# Patient Record
Sex: Male | Born: 1965 | Race: White | Hispanic: No | Marital: Married | Smoking: Former smoker
Health system: Southern US, Community
[De-identification: ages and names within clinical notes are randomized; demographics above are authoritative.]

## PROBLEM LIST (undated history)

## (undated) HISTORY — PX: KNEE SURGERY: SHX244

---
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
## 2004-05-10 ENCOUNTER — Ambulatory Visit: Payer: Self-pay | Admitting: Family Medicine

## 2004-06-04 ENCOUNTER — Ambulatory Visit: Payer: Self-pay | Admitting: Family Medicine

## 2005-04-19 ENCOUNTER — Ambulatory Visit: Payer: Self-pay | Admitting: Family Medicine

## 2006-11-28 ENCOUNTER — Ambulatory Visit: Payer: Self-pay | Admitting: Family Medicine

## 2006-11-28 LAB — CONVERTED CEMR LAB
ALT: 29 units/L (ref 0–40)
Albumin: 4.4 g/dL (ref 3.5–5.2)
Alkaline Phosphatase: 81 units/L (ref 39–117)
BUN: 18 mg/dL (ref 6–23)
Basophils Absolute: 0 10*3/uL (ref 0.0–0.1)
Basophils Relative: 0.2 % (ref 0.0–1.0)
CO2: 31 meq/L (ref 19–32)
Calcium: 9.6 mg/dL (ref 8.4–10.5)
Cholesterol: 228 mg/dL (ref 0–200)
Direct LDL: 129.6 mg/dL
GFR calc Af Amer: 96 mL/min
GFR calc non Af Amer: 79 mL/min
HDL: 62.6 mg/dL (ref >39.0)
Hemoglobin: 16.2 g/dL (ref 13.0–17.0)
Lymphocytes Relative: 25.9 % (ref 12.0–46.0)
MCHC: 35.1 g/dL (ref 30.0–36.0)
Monocytes Absolute: 0.4 10*3/uL (ref 0.2–0.7)
Monocytes Relative: 7.9 % (ref 3.0–11.0)
Neutro Abs: 3.3 10*3/uL (ref 1.4–7.7)
Platelets: 194 10*3/uL (ref 150–400)
Potassium: 4.6 meq/L (ref 3.5–5.1)
TSH: 0.94 microintl units/mL (ref 0.35–5.50)
Total CHOL/HDL Ratio: 3.6
VLDL: 20 mg/dL (ref 0–40)

## 2008-02-26 ENCOUNTER — Ambulatory Visit: Payer: Self-pay | Admitting: Family Medicine

## 2008-02-26 DIAGNOSIS — N508 Other specified disorders of male genital organs: Secondary | ICD-10-CM | POA: Insufficient documentation

## 2008-03-01 ENCOUNTER — Encounter (INDEPENDENT_AMBULATORY_CARE_PROVIDER_SITE_OTHER): Payer: Self-pay | Admitting: *Deleted

## 2008-03-10 ENCOUNTER — Encounter (INDEPENDENT_AMBULATORY_CARE_PROVIDER_SITE_OTHER): Payer: Self-pay | Admitting: *Deleted

## 2008-06-06 ENCOUNTER — Telehealth: Payer: Self-pay | Admitting: Family Medicine

## 2008-06-09 ENCOUNTER — Ambulatory Visit: Payer: Self-pay | Admitting: Family Medicine

## 2008-06-14 ENCOUNTER — Encounter (INDEPENDENT_AMBULATORY_CARE_PROVIDER_SITE_OTHER): Payer: Self-pay | Admitting: *Deleted

## 2009-02-27 ENCOUNTER — Ambulatory Visit: Payer: Self-pay | Admitting: Family Medicine

## 2009-02-27 DIAGNOSIS — R519 Headache, unspecified: Secondary | ICD-10-CM | POA: Insufficient documentation

## 2009-02-27 DIAGNOSIS — R51 Headache: Secondary | ICD-10-CM | POA: Insufficient documentation

## 2009-02-27 DIAGNOSIS — D239 Other benign neoplasm of skin, unspecified: Secondary | ICD-10-CM | POA: Insufficient documentation

## 2010-03-02 ENCOUNTER — Ambulatory Visit: Payer: Self-pay | Admitting: Family Medicine

## 2010-03-02 DIAGNOSIS — F329 Major depressive disorder, single episode, unspecified: Secondary | ICD-10-CM | POA: Insufficient documentation

## 2010-03-28 ENCOUNTER — Ambulatory Visit: Payer: Self-pay | Admitting: Family Medicine

## 2010-07-29 LAB — CONVERTED CEMR LAB
ALT: 29 units/L (ref 0–53)
ALT: 54 units/L — ABNORMAL HIGH (ref 0–53)
AST: 20 units/L (ref 0–37)
AST: 31 units/L (ref 0–37)
Albumin: 4.2 g/dL (ref 3.5–5.2)
Albumin: 4.4 g/dL (ref 3.5–5.2)
Alkaline Phosphatase: 80 units/L (ref 39–117)
BUN: 16 mg/dL (ref 6–23)
Basophils Absolute: 0 10*3/uL (ref 0.0–0.1)
Basophils Relative: 0.1 % (ref 0.0–3.0)
Basophils Relative: 0.5 % (ref 0.0–3.0)
Chloride: 105 meq/L (ref 96–112)
Cholesterol: 195 mg/dL (ref 0–200)
Creatinine, Ser: 1.1 mg/dL (ref 0.4–1.5)
Eosinophils Absolute: 0.1 10*3/uL (ref 0.0–0.7)
Eosinophils Relative: 1.5 % (ref 0.0–5.0)
Eosinophils Relative: 1.6 % (ref 0.0–5.0)
Eosinophils Relative: 2.1 % (ref 0.0–5.0)
GFR calc Af Amer: 95 mL/min
GFR calc non Af Amer: 78 mL/min
GFR calc non Af Amer: 87.07 mL/min (ref >60)
GFR calc non Af Amer: 93.07 mL/min (ref >60)
Glucose, Bld: 74 mg/dL (ref 70–99)
HCT: 44.6 % (ref 39.0–52.0)
HCT: 45.3 % (ref 39.0–52.0)
HCT: 47.9 % (ref 39.0–52.0)
HDL: 61.3 mg/dL (ref >39.0)
HDL: 63.4 mg/dL (ref >39.00)
Hemoglobin: 15.4 g/dL (ref 13.0–17.0)
Hemoglobin: 15.6 g/dL (ref 13.0–17.0)
LDL Cholesterol: 124 mg/dL — ABNORMAL HIGH (ref 0–99)
Lymphs Abs: 1.1 10*3/uL (ref 0.7–4.0)
Lymphs Abs: 1.2 10*3/uL (ref 0.7–4.0)
MCHC: 34.6 g/dL (ref 30.0–36.0)
MCV: 92 fL (ref 78.0–100.0)
Monocytes Absolute: 0.4 10*3/uL (ref 0.1–1.0)
Monocytes Relative: 7.6 % (ref 3.0–12.0)
Monocytes Relative: 8.5 % (ref 3.0–12.0)
Neutro Abs: 3.1 10*3/uL (ref 1.4–7.7)
Neutro Abs: 4.9 10*3/uL (ref 1.4–7.7)
Neutrophils Relative %: 66.4 % (ref 43.0–77.0)
Platelets: 164 10*3/uL (ref 150.0–400.0)
Platelets: 184 10*3/uL (ref 150–400)
Potassium: 4.4 meq/L (ref 3.5–5.1)
Potassium: 4.7 meq/L (ref 3.5–5.1)
Potassium: 5.2 meq/L — ABNORMAL HIGH (ref 3.5–5.1)
Protein, U semiquant: NEGATIVE
RBC: 4.94 M/uL (ref 4.22–5.81)
Sex Hormone Binding: 48 nmol/L (ref 13–71)
Sodium: 138 meq/L (ref 135–145)
Sodium: 141 meq/L (ref 135–145)
TSH: 0.63 microintl units/mL (ref 0.35–5.50)
TSH: 1.05 microintl units/mL (ref 0.35–5.50)
TSH: 1.11 microintl units/mL (ref 0.35–5.50)
Testosterone Free: 82.2 pg/mL (ref 47.0–244.0)
Testosterone-% Free: 1.6 % (ref 1.6–2.9)
Total Bilirubin: 1.1 mg/dL (ref 0.3–1.2)
Total Bilirubin: 1.3 mg/dL — ABNORMAL HIGH (ref 0.3–1.2)
Total CHOL/HDL Ratio: 4
Triglycerides: 77 mg/dL (ref 0–149)
Urobilinogen, UA: NEGATIVE
VLDL: 15 mg/dL (ref 0–40)
VLDL: 16.8 mg/dL (ref 0.0–40.0)
VLDL: 22.6 mg/dL (ref 0.0–40.0)
WBC Urine, dipstick: NEGATIVE
WBC: 4.7 10*3/uL (ref 4.5–10.5)
WBC: 4.8 10*3/uL (ref 4.5–10.5)
WBC: 6.8 10*3/uL (ref 4.5–10.5)

## 2010-07-31 NOTE — Assessment & Plan Note (Signed)
Summary: cpx//Joel Gallegos will be fasting//lch   Vital Signs:  Patient profile:   44 year old male Height:      72 inches Weight:      172.0 pounds BMI:     23.41 Temp:     97.7 degrees F oral Pulse rate:   56 / minute BP sitting:   112 / 72  (right arm)  Vitals Entered By: [name] CMA Duncan Dull) (March 02, 2010 9:06 AM) CC: cpx/fasting   History of Present Illness: Joel Gallegos here for cpe and labs.  No complaints.    Preventive Screening-Counseling & Management  Alcohol-Tobacco     Alcohol drinks/day: 1     Alcohol type: beer     Smoking Status: quit > 6 months     Packs/Day: <0.25     Year Started: 1988     Year Quit: 1990  Caffeine-Diet-Exercise     Caffeine use/day: 1     Does Patient Exercise: yes     Type of exercise: walking     Times/week: 5  Hep-HIV-STD-Contraception     HIV Risk: no     Dental Visit-last 6 months yes     Dental Care Counseling: not indicated; dental care within six months     Sun Exposure-Excessive: no  Safety-Violence-Falls     Seat Belt Use: yes      Sexual History:  currently monogamous.    Current Medications (verified): 1)  Desoximetasone 0.25 % Oint (Desoximetasone) .... Apply Two Times A Day For 2 Weeks  Allergies (verified): No Known Drug Allergies  Past History:  Past Medical History: Last updated: 11/21/2006 Unremarkable  Past Surgical History: Last updated: 11/21/2006 Knee Surgery (Both) 1984  Family History: Last updated: 02/26/2008 None  Social History: Last updated: 02/27/2009 Occupation: [employer]--  [employer] Married Never Smoked Alcohol use-yes Drug use-no Regular exercise-yes  Risk Factors: Alcohol Use: 1 (03/02/2010) Caffeine Use: 1 (03/02/2010) Exercise: yes (03/02/2010)  Risk Factors: Smoking Status: quit > 6 months (03/02/2010) Packs/Day: <0.25 (03/02/2010)  Family History: Reviewed history from 02/26/2008 and no changes required. None  Social History: Reviewed history from 02/27/2009  and no changes required. Occupation: [employer]--  [employer] Married Never Smoked Alcohol use-yes Drug use-no Regular exercise-yes Smoking Status:  quit > 6 months Packs/Day:  <0.25  Review of Systems      See HPI General:  Denies chills, fatigue, fever, loss of appetite, malaise, sleep disorder, sweats, weakness, and weight loss. Eyes:  Denies blurring, discharge, double vision, eye irritation, eye pain, halos, itching, light sensitivity, red eye, vision loss-1 eye, and vision loss-both eyes; optho-- q1y. ENT:  Denies decreased hearing, difficulty swallowing, ear discharge, earache, hoarseness, nasal congestion, nosebleeds, postnasal drainage, ringing in ears, sinus pressure, and sore throat. CV:  Denies bluish discoloration of lips or nails, chest pain or discomfort, difficulty breathing at night, difficulty breathing while lying down, fainting, fatigue, leg cramps with exertion, lightheadness, near fainting, palpitations, shortness of breath with exertion, swelling of feet, swelling of hands, and weight gain. Resp:  Denies chest discomfort, chest pain with inspiration, cough, coughing up blood, excessive snoring, hypersomnolence, morning headaches, pleuritic, shortness of breath, sputum productive, and wheezing. GI:  Denies abdominal pain, bloody stools, change in bowel habits, constipation, dark tarry stools, diarrhea, excessive appetite, gas, hemorrhoids, indigestion, loss of appetite, nausea, vomiting, vomiting blood, and yellowish skin color. GU:  Denies decreased libido, discharge, dysuria, erectile dysfunction, genital sores, hematuria, incontinence, nocturia, urinary frequency, and urinary hesitancy. MS:  Denies joint pain, joint  redness, joint swelling, loss of strength, low back pain, mid back pain, muscle aches, muscle , cramps, muscle weakness, stiffness, and thoracic pain. Derm:  Denies changes in color of skin, changes in nail beds, dryness, excessive perspiration, flushing,  hair loss, insect bite(s), itching, lesion(s), poor wound healing, and rash; derm q1y. Neuro:  Denies brief paralysis, difficulty with concentration, disturbances in coordination, falling down, headaches, inability to speak, memory loss, numbness, poor balance, seizures, sensation of room spinning, tingling, tremors, visual disturbances, and weakness. Psych:  Denies alternate hallucination ( auditory/visual), anxiety, depression, easily angered, easily tearful, irritability, mental problems, panic attacks, sense of great danger, suicidal thoughts/plans, thoughts of violence, unusual visions or sounds, and thoughts /plans of harming others. Endo:  Denies cold intolerance, excessive hunger, excessive thirst, excessive urination, heat intolerance, polyuria, and weight change. Heme:  Denies abnormal bruising, bleeding, enlarge lymph nodes, fevers, pallor, and skin discoloration. Allergy:  Denies hives or rash, itching eyes, persistent infections, seasonal allergies, and sneezing.  Physical Exam  General:  Well-developed,well-nourished,in no acute distress; alert,appropriate and cooperative throughout examination Head:  Normocephalic and atraumatic without obvious abnormalities. No apparent alopecia or balding. Eyes:  vision grossly intact, pupils equal, pupils round, pupils reactive to light, and no injection.  + glassesvision grossly intact, pupils equal, pupils round, pupils reactive to light, and no injection.   Ears:  External ear exam shows no significant lesions or deformities.  Otoscopic examination reveals clear canals, tympanic membranes are intact bilaterally without bulging, retraction, inflammation or discharge. Hearing is grossly normal bilaterally. Nose:  External nasal examination shows no deformity or inflammation. Nasal mucosa are pink and moist without lesions or exudates. Mouth:  Oral mucosa and oropharynx without lesions or exudates.  Teeth in good repair. Neck:  No deformities,  masses, or tenderness noted.no carotid bruits.  no carotid bruits.   Chest Wall:  No deformities, masses, tenderness or gynecomastia noted. Lungs:  Normal respiratory effort, chest expands symmetrically. Lungs are clear to auscultation, no crackles or wheezes. Heart:  normal rate and no murmur.  normal rate and no murmur.   Abdomen:  Bowel sounds positive,abdomen soft and non-tender without masses, organomegaly or hernias noted. Rectal:  No external abnormalities noted. Normal sphincter tone. No rectal masses or tenderness. heme negative brown stool  Genitalia:  Testes bilaterally descended without nodularity, tenderness or masses. No scrotal masses or lesions. No penis lesions or urethral discharge. Prostate:  Prostate gland firm and smooth, no enlargement, nodularity, tenderness, mass, asymmetry or induration. Msk:  normal ROM, no joint tenderness, no joint swelling, no joint warmth, no redness over joints, no joint deformities, no joint instability, and no crepitation.  normal ROM, no joint tenderness, no joint swelling, no joint warmth, no redness over joints, no joint deformities, no joint instability, and no crepitation.   Pulses:  R posterior tibial normal, R dorsalis pedis normal, R carotid normal, L popliteal normal, L posterior tibial normal, and L dorsalis pedis normal.  R posterior tibial normal, R dorsalis pedis normal, R carotid normal, L popliteal normal, L posterior tibial normal, and L dorsalis pedis normal.   Extremities:  No clubbing, cyanosis, edema, or deformity noted with normal full range of motion of all joints.   Neurologic:  No cranial nerve deficits noted. Station and gait are normal. Plantar reflexes are down-going bilaterally. DTRs are symmetrical throughout. Sensory, motor and coordinative functions appear intact. Skin:  Intact without suspicious lesions or rashes Cervical Nodes:  No lymphadenopathy noted Axillary Nodes:  No palpable lymphadenopathy Psych:  Cognition  and judgment appear intact. Alert and cooperative with normal attention span and concentration. No apparent delusions, illusions, hallucinations   Impression & Recommendations:  Problem # 1:  PREVENTIVE HEALTH CARE (ICD-V70.0) ghm utd  Orders: Specimen Handling (86578) Venipuncture (46962) TLB-Lipid Panel (80061-LIPID) TLB-BMP (Basic Metabolic Panel-BMET) (80048-METABOL) TLB-CBC Platelet - w/Differential (85025-CBCD) TLB-Hepatic/Liver Function Pnl (80076-HEPATIC) TLB-TSH (Thyroid Stimulating Hormone) (84443-TSH) TLB-PSA (Prostate Specific Antigen) (84153-PSA) EKG w/ Interpretation (93000)  Reviewed preventive care protocols, scheduled due services, and updated immunizations.  Complete Medication List: 1)  Desoximetasone 0.25 % Oint (Desoximetasone) .... Apply two times a day for 2 weeks  Patient Instructions: 1)  Please schedule a follow-up appointment in 1 year.  Prescriptions: DESOXIMETASONE 0.25 % OINT (DESOXIMETASONE) apply two times a day for 2 weeks  #30g x 2   Entered and Authorized by:   Loreen Freud DO   Signed by:   Loreen Freud DO on 03/02/2010   Method used:   Electronically to        [facility]  [address] [phone]* (retail)       [address]       [place], [place]  [address]       Ph: [phone] or [phone]       Fax: [phone]   RxID:   [id number]    EKG  Procedure date:  03/02/2010  Findings:      Sinus bradycardia with rate 54   Flu Vaccine Result Date:  04/12/2009 Flu Vaccine Result:  given Flu Vaccine Next Due:  1 yr  Appended Document: cpx//Joel Gallegos will be fasting//lch  Laboratory Results   Urine Tests   Date/Time Reported: March 02, 2010 11:25 AM   Routine Urinalysis   Color: yellow Appearance: Clear Glucose: negative   (Normal Range: Negative) Bilirubin: negative   (Normal Range: Negative) Ketone: negative   (Normal Range: Negative) Spec. Gravity: 1.015   (Normal Range: 1.003-1.035) Blood: negative    (Normal Range: Negative) pH: 7.0   (Normal Range: 5.0-8.0) Protein: negative   (Normal Range: Negative) Urobilinogen: negative   (Normal Range: 0-1) Nitrite: negative   (Normal Range: Negative) Leukocyte Esterace: negative   (Normal Range: Negative)    Comments: Floydene Flock  March 02, 2010 11:25 AM

## 2010-07-31 NOTE — Letter (Signed)
Summary: [facility]: Immunoassay Fecal Occult Blood (iFOB) Order Form  [facility]  [address] [place], [place] [address]   Phone: [phone]  Fax: [phone]      [facility]: Immunoassay Fecal Occult Blood (iFOB) Order Form   March 02, 2010 MRN: [id number]   Joel Gallegos [birth year]   Physicican Name: Loreen Freud  Diagnosis Code: V56.71      [name] CMA (AAMA)

## 2011-06-14 ENCOUNTER — Encounter: Payer: Self-pay | Admitting: Family Medicine

## 2011-06-17 ENCOUNTER — Encounter: Payer: Self-pay | Admitting: Family Medicine

## 2011-06-17 ENCOUNTER — Ambulatory Visit (INDEPENDENT_AMBULATORY_CARE_PROVIDER_SITE_OTHER): Payer: Managed Care, Other (non HMO) | Admitting: Family Medicine

## 2011-06-17 VITALS — BP 108/64 | HR 57 | Temp 98.9°F | Ht 72.0 in | Wt 175.0 lb

## 2011-06-17 DIAGNOSIS — Z0000 Encounter for general adult medical examination without abnormal findings: Secondary | ICD-10-CM

## 2011-06-17 LAB — POCT URINALYSIS DIPSTICK
Blood, UA: NEGATIVE
Nitrite, UA: NEGATIVE
Protein, UA: NEGATIVE
Urobilinogen, UA: 0.2
pH, UA: 7

## 2011-06-17 NOTE — Progress Notes (Signed)
Subjective:    Patient ID: Joel Gallegos, male    DOB: 08/15/1965, 45 y.o.   MRN: [id number]  HPI Pt here for cpe and labs.  No complaints.    Review of Systems Review of Systems  Constitutional: Negative for activity change, appetite change and fatigue.  HENT: Negative for hearing loss, congestion, tinnitus and ear discharge.  dentist q67m Eyes: Negative for visual disturbance (see optho q1y -- vision corrected to 20/20 with glasses).  Respiratory: Negative for cough, chest tightness and shortness of breath.   Cardiovascular: Negative for chest pain, palpitations and leg swelling.  Gastrointestinal: Negative for abdominal pain, diarrhea, constipation and abdominal distention.  Genitourinary: Negative for urgency, frequency, decreased urine volume and difficulty urinating.  Musculoskeletal: Negative for back pain, arthralgias and gait problem.  Skin: Negative for color change, pallor and rash.  Neurological: Negative for dizziness, light-headedness, numbness and headaches.  Hematological: Negative for adenopathy. Does not bruise/bleed easily.  Psychiatric/Behavioral: Negative for suicidal ideas, confusion, sleep disturbance, self-injury, dysphoric mood, decreased concentration and agitation.   History reviewed. No pertinent past medical history. History   Social History  . Marital Status: Married    Spouse Name: N/A    Number of Children: N/A  . Years of Education: N/A   Occupational History  . Not on file.   Social History Main Topics  . Smoking status: Never Smoker   . Smokeless tobacco: Not on file  . Alcohol Use: Yes  . Drug Use: No  . Sexually Active: Yes -- Male partner(s)   Other Topics Concern  . Not on file   Social History Narrative   Exercise--no  History reviewed. No pertinent family history.        Objective:   Physical Exam BP 108/64  Pulse 57  Temp(Src) 98.9 F (37.2 C) (Oral)  Ht 6' (1.829 m)  Wt 175 lb (79.379 kg)  BMI 23.73 kg/m2  SpO2  96%  General Appearance:    Alert, cooperative, no distress, appears stated age  Head:    Normocephalic, without obvious abnormality, atraumatic  Eyes:    PERRL, conjunctiva/corneas clear, EOM's intact, fundi    benign, both eyes       Ears:    Normal TM's and external ear canals, both ears  Nose:   Nares normal, septum midline, mucosa normal, no drainage   or sinus tenderness  Throat:   Lips, mucosa, and tongue normal; teeth and gums normal  Neck:   Supple, symmetrical, trachea midline, no adenopathy;       thyroid:  No enlargement/tenderness/nodules; no carotid   bruit or JVD  Back:     Symmetric, no curvature, ROM normal, no CVA tenderness  Lungs:     Clear to auscultation bilaterally, respirations unlabored  Chest wall:    No tenderness or deformity  Heart:    Regular rate and rhythm, S1 and S2 normal, no murmur, rub   or gallop  Abdomen:     Soft, non-tender, bowel sounds active all four quadrants,    no masses, no organomegaly  Genitalia:    Normal male without lesion, discharge or tenderness  Rectal:    Normal tone, normal prostate, no masses or tenderness;   guaiac negative stool  Extremities:   Extremities normal, atraumatic, no cyanosis or edema  Pulses:   2+ and symmetric all extremities  Skin:   Skin color, texture, turgor normal, no rashes or lesions  Lymph nodes:   Cervical, supraclavicular, and axillary nodes normal  Neurologic:  CNII-XII intact. Normal strength, sensation and reflexes      throughout          Assessment & Plan:  cpe-- ghm utd           Check labs             rto 1 year or prn

## 2011-06-17 NOTE — Patient Instructions (Signed)
Preventative Care for Adults, Male A healthy lifestyle and preventative care can promote health and wellness. Preventative health guidelines for men include the following key practices:  A routine yearly physical is a good way to check with your caregiver about your health and preventative screening. It is a chance to share any concerns and updates on your health, and to receive a thorough exam.   Visit your dentist for a routine exam and preventative care every 6 months. Brush your teeth twice a day and floss once a day. Good oral hygiene prevents tooth decay and gum disease.   The frequency of eye exams is based on your age, health, family medical history, use of contact lenses, and other factors. Follow your caregiver's recommendations for frequency of eye exams.   Eat a healthy diet. Foods like vegetables, fruits, whole grains, low-fat dairy products, and lean protein foods contain the nutrients you need without too many calories. Decrease your intake of foods high in solid fats, added sugars, and salt. Eat the right amount of calories for you.Get information about a proper diet from your caregiver, if necessary.   Regular physical exercise is one of the most important things you can do for your health. Most adults should get at least 150 minutes of moderate-intensity exercise (any activity that increases your heart rate and causes you to sweat) each week. In addition, most adults need muscle-strengthening exercises on 2 or more days a week.   Maintain a healthy weight. The body mass index (BMI) is a screening tool to identify possible weight problems. It provides an estimate of body fat based on height and weight. Your caregiver can help determine your BMI, and can help you achieve or maintain a healthy weight.For adults 20 years and older:   A BMI below 18.5 is considered underweight.   A BMI of 18.5 to 24.9 is normal.   A BMI of 25 to 29.9 is considered overweight.   A BMI of 30 and  above is considered obese.   Maintain normal blood lipids and cholesterol levels by exercising and minimizing your intake of saturated fat. Eat a balanced diet with plenty of fruit and vegetables. Blood tests for lipids and cholesterol should begin at age 20 and be repeated every 5 years. If your lipid or cholesterol levels are high, you are over 50, or you are a high risk for heart disease, you may need your cholesterol levels checked more frequently.Ongoing high lipid and cholesterol levels should be treated with medicines if diet and exercise are not effective.   If you smoke, find out from your caregiver how to quit. If you do not use tobacco, do not start.   If you choose to drink alcohol, do not exceed 2 drinks per day. One drink is considered to be 12 ounces (355 mL) of beer, 5 ounces (148 mL) of wine, or 1.5 ounces (44 mL) of liquor.   Avoid use of street drugs. Do not share needles with anyone. Ask for help if you need support or instructions about stopping the use of drugs.   High blood pressure causes heart disease and increases the risk of stroke. Your blood pressure should be checked at least every 1 to 2 years. Ongoing high blood pressure should be treated with medicines, if weight loss and exercise are not effective.   If you are 45 to 44 years old, ask your caregiver if you should take aspirin to prevent heart disease.   Diabetes screening involves taking a blood   sample to check your fasting blood sugar level. This should be done once every 3 years, after age 45, if you are within normal weight and without risk factors for diabetes. Testing should be considered at a younger age or be carried out more frequently if you are overweight and have at least 1 risk factor for diabetes.   Colorectal cancer can be detected and often prevented. Most routine colorectal cancer screening begins at the age of 50 and continues through age 75. However, your caregiver may recommend screening at an  earlier age if you have risk factors for colon cancer. On a yearly basis, your caregiver may provide home test kits to check for hidden blood in the stool. Use of a small camera at the end of a tube, to directly examine the colon (sigmoidoscopy or colonoscopy), can detect the earliest forms of colorectal cancer. Talk to your caregiver about this at age 50, when routine screening begins. Direct examination of the colon should be repeated every 5 to 10 years through age 75, unless early forms of pre-cancerous polyps or small growths are found.   Practice safe sex. Use condoms and avoid high-risk sexual practices to reduce the spread of sexually transmitted infections (STIs). STIs include gonorrhea, chlamydia, syphilis, trichomonas, herpes, HPV, and human immunodeficiency virus (HIV). Herpes, HIV, and HPV are viral illnesses that have no cure. They can result in disability, cancer, and death.   A one-time screening for abdominal aortic aneurysm (AAA) and surgical repair of large AAAs by sound wave imaging (ultrasonography) is recommended for ages 65 to 75 years who are current or former smokers.   Healthy men should no longer receive prostate-specific antigen (PSA) blood tests as part of routine cancer screening. Consult with your caregiver about prostate cancer screening.   Use sunscreen with skin protection factor (SPF) of 30 or more. Apply sunscreen liberally and repeatedly throughout the day. You should seek shade when your shadow is shorter than you. Protect yourself by wearing long sleeves, pants, a wide-brimmed hat, and sunglasses year round, whenever you are outdoors.   Once a month, do a whole body skin exam, using a mirror to look at the skin on your back. Notify your caregiver of new moles, moles that have irregular borders, moles that are larger than a pencil eraser, or moles that have changed in shape or color.   Stay current with required immunizations.   Influenza. You need a dose every  fall (or winter). The composition of the flu vaccine changes each year, so being vaccinated once is not enough.   Pneumococcal polysaccharide. You need 1 to 2 doses if you smoke cigarettes or if you have certain chronic medical conditions. You need 1 dose at age 65 (or older) if you have never been vaccinated.   Tetanus, diphtheria, pertussis (Tdap, Td). Get 1 dose of Tdap vaccine if you are younger than age 65 years, are over 65 and have contact with an infant, are a healthcare worker, or simply want to be protected from whooping cough. After that, you need a Td booster dose every 10 years. Consult your caregiver if you have not had at least 3 tetanus and diphtheria-containing shots sometime in your life or have a deep or dirty wound.   HPV. This vaccine is recommended for males 13 through 44 years of age. This vaccine may be given to men 22 through 44 years of age who have not completed the 3 dose series. It is recommended for men through age 26   who have sex with men or whose immune system is weakened because of HIV infection, other illness, or medications. The vaccine is given in 3 doses over 6 months.   Measles, mumps, rubella (MMR). You need at least 1 dose of MMR if you were born in 1957 or later. You may also need a 2nd dose.   Meningococcal. If you are age 19 to 21 years and a first-year college student living in a residence hall, or have one of several medical conditions, you need to get vaccinated against meningococcal disease. You may also need additional booster doses.   Zoster (shingles). If you are age 60 years or older, you should get this vaccine.   Varicella (chickenpox). If you have never had chickenpox or you were vaccinated but received only 1 dose, talk to your caregiver to find out if you need this vaccine.   Hepatitis A. You need this vaccine if you have a specific risk factor for hepatitis A virus infection, or you simply wish to be protected from this disease. The vaccine is  usually given as 2 doses, 6 to 18 months apart.   Hepatitis B. You need this vaccine if you have a specific risk factor for hepatitis B virus infection or you simply wish to be protected from this disease. The vaccine is given in 3 doses, usually over 6 months.  Preventative Service / Frequency Ages 19 to 39  Blood pressure check.** / Every 1 to 2 years.   Lipid and cholesterol check.**/ Every 5 years beginning at age 20.   Skin self-exam. / Monthly.   Influenza immunization.** / Every year.   Pneumococcal polysaccharide immunization.** / 1 to 2 doses if you smoke cigarettes or if you have certain chronic medical conditions.   Tetanus, diphtheria, pertussis (Tdap,Td) immunization. / A one-time dose of Tdap vaccine. After that, you need a Td booster dose every 10 years.   HPV immunization. / 3 doses over 6 months, if 26 and younger.   Measles, mumps, rubella (MMR) immunization. / You need at least 1 dose of MMR if you were born in 1957 or later. You may also need a 2nd dose.   Meningococcal immunization. / 1 dose if you are age 19 to 21 years and a first-year college student living in a residence hall, or have one of several medical conditions, you need to get vaccinated against meningococcal disease. You may also need additional booster doses.   Varicella immunization. **/ Consult your caregiver.   Hepatitis A immunization. ** / Consult your caregiver. 2 doses, 6 to 18 months apart.   Hepatitis B immunization.** / Consult your caregiver. 3 doses usually over 6 months.  Ages 40 to 64  Blood pressure check.** / Every 1 to 2 years.   Lipid and cholesterol check.**/ Every 5 years beginning at age 20.   Fecal occult blood test (FOBT) of stool. / Every year beginning at age 50 and continuing until age 75. You may not have to do this test if you get colonoscopy every 10 years.   Flexible sigmoidoscopy** or colonoscopy.** / Every 5 years for a flexible sigmoidoscopy or every 10 years for  a colonoscopy beginning at age 50 and continuing until age 75.   Skin self-exam. / Monthly.   Influenza immunization.** / Every year.   Pneumococcal polysaccharide immunization.** / 1 to 2 doses if you smoke cigarettes or if you have certain chronic medical conditions.   Tetanus, diphtheria, pertussis (Tdap/Td) immunization.** / A one-time dose of   Tdap vaccine. After that, you need a Td booster dose every 10 years.   Measles, mumps, rubella (MMR) immunization. / You need at least 1 dose of MMR if you were born in 1957 or later. You may also need a 2nd dose.   Varicella immunization. **/ Consult your caregiver.   Meningococcal immunization.** / Consult your caregiver.   Hepatitis A immunization. ** / Consult your caregiver. 2 doses, 6 to 18 months apart.   Hepatitis B immunization.** / Consult your caregiver. 3 doses, usually over 6 months.  Ages 65 and over  Blood pressure check.** / Every 1 to 2 years.   Lipid and cholesterol check.**/ Every 5 years beginning at age 20.   Fecal occult blood test (FOBT) of stool. / Every year beginning at age 50 and continuing until age 75. You may not have to do this test if you get colonoscopy every 10 years.   Flexible sigmoidoscopy** or colonoscopy.** / Every 5 years for a flexible sigmoidoscopy or every 10 years for a colonoscopy beginning at age 50 and continuing until age 75.   Abdominal aortic aneurysm (AAA) screening.** / A one-time screening for ages 65 to 75 years who are current or former smokers.   Skin self-exam. / Monthly.   Influenza immunization.** / Every year.   Pneumococcal polysaccharide immunization.** / 1 dose at age 65 (or older) if you have never been vaccinated.   Tetanus, diphtheria, pertussis (Tdap, Td) immunization. / A one-time dose of Tdap vaccine if you are over 65 and have contact with an infant, are a healthcare worker, or simply want to be protected from whooping cough. After that, you need a Td booster dose  every 10 years.   Varicella immunization. **/ Consult your caregiver.   Meningococcal immunization.** / Consult your caregiver.   Hepatitis A immunization. ** / Consult your caregiver. 2 doses, 6 to 18 months apart.   Hepatitis B immunization.** / Check with your caregiver. 3 doses, usually over 6 months.  **Family history and personal history of risk and conditions may change your caregiver's recommendations. Document Released: 08/13/2001 Document Revised: 02/27/2011 Document Reviewed: 11/12/2010 [employer] Patient Information 2012 [employer]. 

## 2011-06-18 LAB — LIPID PANEL
Cholesterol: 209 mg/dL — ABNORMAL HIGH (ref 0–200)
Total CHOL/HDL Ratio: 3
Triglycerides: 63 mg/dL (ref 0.0–149.0)
VLDL: 12.6 mg/dL (ref 0.0–40.0)

## 2011-06-18 LAB — HEPATIC FUNCTION PANEL: Total Bilirubin: 1 mg/dL (ref 0.3–1.2)

## 2011-06-18 LAB — BASIC METABOLIC PANEL
BUN: 17 mg/dL (ref 6–23)
Creatinine, Ser: 0.9 mg/dL (ref 0.4–1.5)
GFR: 97.27 mL/min (ref >60.00)
Glucose, Bld: 82 mg/dL (ref 70–99)
Potassium: 4.5 mEq/L (ref 3.5–5.1)

## 2011-06-18 LAB — CBC WITH DIFFERENTIAL/PLATELET
Basophils Absolute: 0 10*3/uL (ref 0.0–0.1)
HCT: 45.2 % (ref 39.0–52.0)
Lymphs Abs: 1.6 10*3/uL (ref 0.7–4.0)
MCV: 91.9 fl (ref 78.0–100.0)
Monocytes Absolute: 0.4 10*3/uL (ref 0.1–1.0)
Monocytes Relative: 12.9 % — ABNORMAL HIGH (ref 3.0–12.0)
Neutrophils Relative %: 30.8 % — ABNORMAL LOW (ref 43.0–77.0)
Platelets: 167 10*3/uL (ref 150.0–400.0)
RDW: 13.4 % (ref 11.5–14.6)

## 2011-07-09 ENCOUNTER — Telehealth: Payer: Self-pay | Admitting: Family Medicine

## 2011-07-09 NOTE — Telephone Encounter (Signed)
Mailed    KP 

## 2011-07-09 NOTE — Telephone Encounter (Signed)
Patient is requesting results of fasting cpe labs be mailed to him

## 2012-06-11 ENCOUNTER — Telehealth: Payer: Self-pay

## 2012-06-11 ENCOUNTER — Other Ambulatory Visit: Payer: Self-pay | Admitting: Family Medicine

## 2012-06-11 DIAGNOSIS — Z0000 Encounter for general adult medical examination without abnormal findings: Secondary | ICD-10-CM

## 2012-06-11 NOTE — Telephone Encounter (Signed)
Patirnt wanted all labs done--- Orders put in and he will have labs done next week     KP

## 2012-06-11 NOTE — Telephone Encounter (Signed)
Does he want all labs or just psa.  Schedule cpe for january

## 2012-06-11 NOTE — Telephone Encounter (Signed)
Please advise      KP 

## 2012-06-11 NOTE — Telephone Encounter (Signed)
Message copied by Arnette Norris on Thu Jun 11, 2012 11:18 AM ------      Message from: Arvilla Meres P      Created: Wed Jun 10, 2012  2:54 PM      Contact: [phone]       Patient's wife Drinda Butts) would like to know if the patient can come in for PSA lab. Patient is due for a physical (and will schedule one) but needs the PSA before the end of the year for insurance reimbursement. Is this something we can do for him?

## 2012-06-16 ENCOUNTER — Other Ambulatory Visit (INDEPENDENT_AMBULATORY_CARE_PROVIDER_SITE_OTHER): Payer: Managed Care, Other (non HMO)

## 2012-06-16 DIAGNOSIS — Z0000 Encounter for general adult medical examination without abnormal findings: Secondary | ICD-10-CM

## 2012-06-16 LAB — HEPATIC FUNCTION PANEL
ALT: 20 U/L (ref 0–53)
AST: 16 U/L (ref 0–37)
Alkaline Phosphatase: 75 U/L (ref 39–117)
Bilirubin, Direct: 0.2 mg/dL (ref 0.0–0.3)
Total Bilirubin: 1.3 mg/dL — ABNORMAL HIGH (ref 0.3–1.2)
Total Protein: 7.1 g/dL (ref 6.0–8.3)

## 2012-06-16 LAB — BASIC METABOLIC PANEL
BUN: 20 mg/dL (ref 6–23)
Creatinine, Ser: 0.9 mg/dL (ref 0.4–1.5)
GFR: 94.41 mL/min (ref >60.00)
Glucose, Bld: 85 mg/dL (ref 70–99)

## 2012-06-16 LAB — PSA: PSA: 1.01 ng/mL (ref 0.10–4.00)

## 2012-06-16 LAB — TSH: TSH: 1.22 u[IU]/mL (ref 0.35–5.50)

## 2012-06-17 LAB — POCT URINALYSIS DIPSTICK
Bilirubin, UA: NEGATIVE
Blood, UA: NEGATIVE
Glucose, UA: NEGATIVE
Ketones, UA: NEGATIVE
Leukocytes, UA: NEGATIVE
pH, UA: 6

## 2012-06-23 ENCOUNTER — Encounter: Payer: Self-pay | Admitting: *Deleted

## 2012-07-21 ENCOUNTER — Ambulatory Visit (INDEPENDENT_AMBULATORY_CARE_PROVIDER_SITE_OTHER): Payer: Managed Care, Other (non HMO) | Admitting: Family Medicine

## 2012-07-21 ENCOUNTER — Encounter: Payer: Self-pay | Admitting: Family Medicine

## 2012-07-21 VITALS — BP 100/70 | HR 58 | Temp 97.5°F | Wt 173.0 lb

## 2012-07-21 DIAGNOSIS — Z0000 Encounter for general adult medical examination without abnormal findings: Secondary | ICD-10-CM

## 2012-07-21 DIAGNOSIS — R21 Rash and other nonspecific skin eruption: Secondary | ICD-10-CM

## 2012-07-21 MED ORDER — DESOXIMETASONE 0.25 % EX OINT: 1.0000 "application " | TOPICAL_OINTMENT | Freq: Two times a day (BID) | CUTANEOUS | Status: DC

## 2012-07-21 NOTE — Patient Instructions (Signed)
Preventive Care for Adults, Male A healthy lifestyle and preventive care can promote health and wellness. Preventive health guidelines for men include the following key practices:  A routine yearly physical is a good way to check with your caregiver about your health and preventative screening. It is a chance to share any concerns and updates on your health, and to receive a thorough exam.  Visit your dentist for a routine exam and preventative care every 6 months. Brush your teeth twice a day and floss once a day. Good oral hygiene prevents tooth decay and gum disease.  The frequency of eye exams is based on your age, health, family medical history, use of contact lenses, and other factors. Follow your caregiver's recommendations for frequency of eye exams.  Eat a healthy diet. Foods like vegetables, fruits, whole grains, low-fat dairy products, and lean protein foods contain the nutrients you need without too many calories. Decrease your intake of foods high in solid fats, added sugars, and salt. Eat the right amount of calories for you.Get information about a proper diet from your caregiver, if necessary.  Regular physical exercise is one of the most important things you can do for your health. Most adults should get at least 150 minutes of moderate-intensity exercise (any activity that increases your heart rate and causes you to sweat) each week. In addition, most adults need muscle-strengthening exercises on 2 or more days a week.  Maintain a healthy weight. The body mass index (BMI) is a screening tool to identify possible weight problems. It provides an estimate of body fat based on height and weight. Your caregiver can help determine your BMI, and can help you achieve or maintain a healthy weight.For adults 20 years and older:  A BMI below 18.5 is considered underweight.  A BMI of 18.5 to 24.9 is normal.  A BMI of 25 to 29.9 is considered overweight.  A BMI of 30 and above is  considered obese.  Maintain normal blood lipids and cholesterol levels by exercising and minimizing your intake of saturated fat. Eat a balanced diet with plenty of fruit and vegetables. Blood tests for lipids and cholesterol should begin at age 20 and be repeated every 5 years. If your lipid or cholesterol levels are high, you are over 50, or you are a high risk for heart disease, you may need your cholesterol levels checked more frequently.Ongoing high lipid and cholesterol levels should be treated with medicines if diet and exercise are not effective.  If you smoke, find out from your caregiver how to quit. If you do not use tobacco, do not start.  If you choose to drink alcohol, do not exceed 2 drinks per day. One drink is considered to be 12 ounces (355 mL) of beer, 5 ounces (148 mL) of wine, or 1.5 ounces (44 mL) of liquor.  Avoid use of street drugs. Do not share needles with anyone. Ask for help if you need support or instructions about stopping the use of drugs.  High blood pressure causes heart disease and increases the risk of stroke. Your blood pressure should be checked at least every 1 to 2 years. Ongoing high blood pressure should be treated with medicines, if weight loss and exercise are not effective.  If you are 45 to 46 years old, ask your caregiver if you should take aspirin to prevent heart disease.  Diabetes screening involves taking a blood sample to check your fasting blood sugar level. This should be done once every 3 years,   after age 45, if you are within normal weight and without risk factors for diabetes. Testing should be considered at a younger age or be carried out more frequently if you are overweight and have at least 1 risk factor for diabetes.  Colorectal cancer can be detected and often prevented. Most routine colorectal cancer screening begins at the age of 50 and continues through age 75. However, your caregiver may recommend screening at an earlier age if you  have risk factors for colon cancer. On a yearly basis, your caregiver may provide home test kits to check for hidden blood in the stool. Use of a small camera at the end of a tube, to directly examine the colon (sigmoidoscopy or colonoscopy), can detect the earliest forms of colorectal cancer. Talk to your caregiver about this at age 50, when routine screening begins. Direct examination of the colon should be repeated every 5 to 10 years through age 75, unless early forms of pre-cancerous polyps or small growths are found.  Hepatitis C blood testing is recommended for all people born from 1945 through 1965 and any individual with known risks for hepatitis C.  Practice safe sex. Use condoms and avoid high-risk sexual practices to reduce the spread of sexually transmitted infections (STIs). STIs include gonorrhea, chlamydia, syphilis, trichomonas, herpes, HPV, and human immunodeficiency virus (HIV). Herpes, HIV, and HPV are viral illnesses that have no cure. They can result in disability, cancer, and death.  A one-time screening for abdominal aortic aneurysm (AAA) and surgical repair of large AAAs by sound wave imaging (ultrasonography) is recommended for ages 65 to 75 years who are current or former smokers.  Healthy men should no longer receive prostate-specific antigen (PSA) blood tests as part of routine cancer screening. Consult with your caregiver about prostate cancer screening.  Testicular cancer screening is not recommended for adult males who have no symptoms. Screening includes self-exam, caregiver exam, and other screening tests. Consult with your caregiver about any symptoms you have or any concerns you have about testicular cancer.  Use sunscreen with skin protection factor (SPF) of 30 or more. Apply sunscreen liberally and repeatedly throughout the day. You should seek shade when your shadow is shorter than you. Protect yourself by wearing long sleeves, pants, a wide-brimmed hat, and  sunglasses year round, whenever you are outdoors.  Once a month, do a whole body skin exam, using a mirror to look at the skin on your back. Notify your caregiver of new moles, moles that have irregular borders, moles that are larger than a pencil eraser, or moles that have changed in shape or color.  Stay current with required immunizations.  Influenza. You need a dose every fall (or winter). The composition of the flu vaccine changes each year, so being vaccinated once is not enough.  Pneumococcal polysaccharide. You need 1 to 2 doses if you smoke cigarettes or if you have certain chronic medical conditions. You need 1 dose at age 65 (or older) if you have never been vaccinated.  Tetanus, diphtheria, pertussis (Tdap, Td). Get 1 dose of Tdap vaccine if you are younger than age 65 years, are over 65 and have contact with an infant, are a healthcare worker, or simply want to be protected from whooping cough. After that, you need a Td booster dose every 10 years. Consult your caregiver if you have not had at least 3 tetanus and diphtheria-containing shots sometime in your life or have a deep or dirty wound.  HPV. This vaccine is recommended   for males 13 through 46 years of age. This vaccine may be given to men 22 through 46 years of age who have not completed the 3 dose series. It is recommended for men through age 26 who have sex with men or whose immune system is weakened because of HIV infection, other illness, or medications. The vaccine is given in 3 doses over 6 months.  Measles, mumps, rubella (MMR). You need at least 1 dose of MMR if you were born in 1957 or later. You may also need a 2nd dose.  Meningococcal. If you are age 19 to 21 years and a first-year college student living in a residence hall, or have one of several medical conditions, you need to get vaccinated against meningococcal disease. You may also need additional booster doses.  Zoster (shingles). If you are age 60 years or  older, you should get this vaccine.  Varicella (chickenpox). If you have never had chickenpox or you were vaccinated but received only 1 dose, talk to your caregiver to find out if you need this vaccine.  Hepatitis A. You need this vaccine if you have a specific risk factor for hepatitis A virus infection, or you simply wish to be protected from this disease. The vaccine is usually given as 2 doses, 6 to 18 months apart.  Hepatitis B. You need this vaccine if you have a specific risk factor for hepatitis B virus infection or you simply wish to be protected from this disease. The vaccine is given in 3 doses, usually over 6 months. Preventative Service / Frequency Ages 19 to 39  Blood pressure check.** / Every 1 to 2 years.  Lipid and cholesterol check.** / Every 5 years beginning at age 20.  Hepatitis C blood test.** / For any individual with known risks for hepatitis C.  Skin self-exam. / Monthly.  Influenza immunization.** / Every year.  Pneumococcal polysaccharide immunization.** / 1 to 2 doses if you smoke cigarettes or if you have certain chronic medical conditions.  Tetanus, diphtheria, pertussis (Tdap,Td) immunization. / A one-time dose of Tdap vaccine. After that, you need a Td booster dose every 10 years.  HPV immunization. / 3 doses over 6 months, if 26 and younger.  Measles, mumps, rubella (MMR) immunization. / You need at least 1 dose of MMR if you were born in 1957 or later. You may also need a 2nd dose.  Meningococcal immunization. / 1 dose if you are age 19 to 21 years and a first-year college student living in a residence hall, or have one of several medical conditions, you need to get vaccinated against meningococcal disease. You may also need additional booster doses.  Varicella immunization.** / Consult your caregiver.  Hepatitis A immunization.** / Consult your caregiver. 2 doses, 6 to 18 months apart.  Hepatitis B immunization.** / Consult your caregiver. 3 doses  usually over 6 months. Ages 40 to 64  Blood pressure check.** / Every 1 to 2 years.  Lipid and cholesterol check.** / Every 5 years beginning at age 20.  Fecal occult blood test (FOBT) of stool. / Every year beginning at age 50 and continuing until age 75. You may not have to do this test if you get colonoscopy every 10 years.  Flexible sigmoidoscopy** or colonoscopy.** / Every 5 years for a flexible sigmoidoscopy or every 10 years for a colonoscopy beginning at age 50 and continuing until age 75.  Hepatitis C blood test.** / For all people born from 1945 through 1965 and any   individual with known risks for hepatitis C.  Skin self-exam. / Monthly.  Influenza immunization.** / Every year.  Pneumococcal polysaccharide immunization.** / 1 to 2 doses if you smoke cigarettes or if you have certain chronic medical conditions.  Tetanus, diphtheria, pertussis (Tdap/Td) immunization.** / A one-time dose of Tdap vaccine. After that, you need a Td booster dose every 10 years.  Measles, mumps, rubella (MMR) immunization. / You need at least 1 dose of MMR if you were born in 1957 or later. You may also need a 2nd dose.  Varicella immunization.**/ Consult your caregiver.  Meningococcal immunization.** / Consult your caregiver.  Hepatitis A immunization.** / Consult your caregiver. 2 doses, 6 to 18 months apart.  Hepatitis B immunization.** / Consult your caregiver. 3 doses, usually over 6 months. Ages 65 and over  Blood pressure check.** / Every 1 to 2 years.  Lipid and cholesterol check.**/ Every 5 years beginning at age 20.  Fecal occult blood test (FOBT) of stool. / Every year beginning at age 50 and continuing until age 75. You may not have to do this test if you get colonoscopy every 10 years.  Flexible sigmoidoscopy** or colonoscopy.** / Every 5 years for a flexible sigmoidoscopy or every 10 years for a colonoscopy beginning at age 50 and continuing until age 75.  Hepatitis C blood  test.** / For all people born from 1945 through 1965 and any individual with known risks for hepatitis C.  Abdominal aortic aneurysm (AAA) screening.** / A one-time screening for ages 65 to 75 years who are current or former smokers.  Skin self-exam. / Monthly.  Influenza immunization.** / Every year.  Pneumococcal polysaccharide immunization.** / 1 dose at age 65 (or older) if you have never been vaccinated.  Tetanus, diphtheria, pertussis (Tdap, Td) immunization. / A one-time dose of Tdap vaccine if you are over 65 and have contact with an infant, are a healthcare worker, or simply want to be protected from whooping cough. After that, you need a Td booster dose every 10 years.  Varicella immunization. ** / Consult your caregiver.  Meningococcal immunization.** / Consult your caregiver.  Hepatitis A immunization. ** / Consult your caregiver. 2 doses, 6 to 18 months apart.  Hepatitis B immunization.** / Check with your caregiver. 3 doses, usually over 6 months. **Family history and personal history of risk and conditions may change your caregiver's recommendations. Document Released: 08/13/2001 Document Revised: 09/09/2011 Document Reviewed: 11/12/2010 [employer] Patient Information 2013 [employer].  

## 2012-07-21 NOTE — Progress Notes (Signed)
  Subjective:    Patient ID: Joel Gallegos, male    DOB: 05-02-1966, 47 y.o.   MRN: [id number]  HPI Pt here for cpe and review labs.  No complaints.     Review of Systems Review of Systems  Constitutional: Negative for activity change, appetite change and fatigue.  HENT: Negative for hearing loss, congestion, tinnitus and ear discharge.  dentist q75m Eyes: Negative for visual disturbance (see optho q1y -- vision corrected to 20/20 with glasses).  Respiratory: Negative for cough, chest tightness and shortness of breath.   Cardiovascular: Negative for chest pain, palpitations and leg swelling.  Gastrointestinal: Negative for abdominal pain, diarrhea, constipation and abdominal distention.  Genitourinary: Negative for urgency, frequency, decreased urine volume and difficulty urinating.  Musculoskeletal: Negative for back pain, arthralgias and gait problem.  Skin: Negative for color change, pallor and rash.  Neurological: Negative for dizziness, light-headedness, numbness and headaches.  Hematological: Negative for adenopathy. Does not bruise/bleed easily.  Psychiatric/Behavioral: Negative for suicidal ideas, confusion, sleep disturbance, self-injury, dysphoric mood, decreased concentration and agitation.    History reviewed. No pertinent past medical history. History reviewed. No pertinent family history. History   Social History  . Marital Status: Married    Spouse Name: N/A    Number of Children: N/A  . Years of Education: N/A   Occupational History  . Not on file.   Social History Main Topics  . Smoking status: Never Smoker   . Smokeless tobacco: Not on file  . Alcohol Use: Yes  . Drug Use: No  . Sexually Active: Yes -- Male partner(s)   Other Topics Concern  . Not on file   Social History Narrative   Exercise--no   Past Surgical History  Procedure Date  . Knee surgery         Objective:   Physical Exam  BP 100/70  Pulse 58  Temp 97.5 F (36.4 C) (Oral)   Wt 173 lb (78.472 kg)  SpO2 94% General appearance: alert, cooperative, appears stated age and no distress Head: Normocephalic, without obvious abnormality, atraumatic Eyes: conjunctivae/corneas clear. PERRL, EOM's intact. Fundi benign. Ears: normal TM's and external ear canals both ears Nose: Nares normal. Septum midline. Mucosa normal. No drainage or sinus tenderness. Throat: lips, mucosa, and tongue normal; teeth and gums normal Neck: no adenopathy, no carotid bruit, no JVD, supple, symmetrical, trachea midline and thyroid not enlarged, symmetric, no tenderness/mass/nodules Back: symmetric, no curvature. ROM normal. No CVA tenderness. Lungs: clear to auscultation bilaterally Chest wall: no tenderness Heart: regular rate and rhythm, S1, S2 normal, no murmur, click, rub or gallop Abdomen: soft, non-tender; bowel sounds normal; no masses,  no organomegaly Male genitalia: normal Rectal: normal tone, normal prostate, no masses or tenderness--rectal heme neg Extremities: extremities normal, atraumatic, no cyanosis or edema Pulses: 2+ and symmetric Skin: Skin color, texture, turgor normal. No rashes or lesions Lymph nodes: Cervical, supraclavicular, and axillary nodes normal. Neurologic: Alert and oriented X 3, normal strength and tone. Normal symmetric reflexes. Normal coordination and gait Psych- no depression, no anxiety      Assessment & Plan:  cpe--  Labs reviewed           ghm utd           rto 1 year

## 2012-08-18 ENCOUNTER — Encounter: Payer: Self-pay | Admitting: Family Medicine

## 2012-08-18 DIAGNOSIS — R5381 Other malaise: Secondary | ICD-10-CM

## 2012-08-19 ENCOUNTER — Encounter: Payer: Self-pay | Admitting: Family Medicine

## 2012-08-19 ENCOUNTER — Other Ambulatory Visit: Payer: Managed Care, Other (non HMO)

## 2012-08-19 DIAGNOSIS — R5381 Other malaise: Secondary | ICD-10-CM

## 2012-08-20 LAB — TESTOSTERONE, FREE, TOTAL, SHBG
Sex Hormone Binding: 51 nmol/L (ref 13–71)
Testosterone, Free: 82.7 pg/mL (ref 47.0–244.0)
Testosterone: 522 ng/dL (ref 300–890)

## 2012-08-24 ENCOUNTER — Ambulatory Visit (INDEPENDENT_AMBULATORY_CARE_PROVIDER_SITE_OTHER): Payer: Managed Care, Other (non HMO) | Admitting: Family Medicine

## 2012-08-24 ENCOUNTER — Encounter: Payer: Self-pay | Admitting: Family Medicine

## 2012-08-24 VITALS — BP 116/74 | HR 63 | Temp 97.8°F | Wt 169.6 lb

## 2012-08-24 DIAGNOSIS — N529 Male erectile dysfunction, unspecified: Secondary | ICD-10-CM | POA: Insufficient documentation

## 2012-08-24 NOTE — Assessment & Plan Note (Signed)
Samples of cialis and levitra given to patient with instructions on use  Refer to urology

## 2012-08-24 NOTE — Progress Notes (Signed)
  Subjective:    Patient ID: Joel Gallegos, male    DOB: 1966/03/12, 47 y.o.   MRN: [id number]  HPI Pt here to discuss labs.  He thought his testosterone was low because he was having trouble with  ED.   Pt is requesting to see a specialist since his labs were low normal and he has tried viagra in the past and he caused headaches.   No other complaints.   Review of Systems As above    Objective:   Physical Exam  BP 116/74  Pulse 63  Temp(Src) 97.8 F (36.6 C) (Oral)  Wt 169 lb 9.6 oz (76.93 kg)  BMI 23 kg/m2  SpO2 95% General appearance: alert, cooperative and appears stated age Neurologic: Mental status: Alert, oriented, thought content appropriate      Assessment & Plan:

## 2012-08-24 NOTE — Patient Instructions (Addendum)
Erectile Dysfunction  Erectile dysfunction (ED) is the inability to get a good enough erection to have sexual intercourse. ED may involve:  · Inability to get an erection.  · Lack of enough hardness to allow penetration.  · Loss of the erection before sex is finished.  · Premature ejaculation.  · Any combination of these problems if they occur more than 25% of the time.  CAUSES  · Certain drugs, such as:  · Pain relievers.  · Antihistamines.  · Antidepressants.  · Blood pressure medicines.  · Water pills.  · Ulcer medicines.  · Muscle relaxants.  · Illegal drugs.  · Excessive drinking.  · Psychological causes, such as:  · Anxiety.  · Depression.  · Sadness.  · Exhaustion.  · Performance fear.  · Stress.  · Physical causes, such as:  · Artery problems. This may include diabetes, smoking, liver disease, or atherosclerosis.  · High blood pressure.  · Hormonal problems, such as low testosterone.  · Obesity.  · Nerve problems. This may include back or pelvic injuries, diabetes, multiple sclerosis, Parkinson's disease, or some surgeries.  SYMPTOMS  · Inability to get an erection.  · Lack of enough hardness to allow penetration.  · Loss of the erection before sex is finished.  · Premature ejaculation.  · Normal erections at some times, but with frequent unsatisfactory episodes.  · Orgasms that are not satisfactory in sensation or frequency.  · Low sexual satisfaction in either partner because of erection problems.  · A curved penis occurring with erection. The curve may cause pain or may be too curved to allow for intercourse.  · Never having nighttime erections.  DIAGNOSIS  Your caregiver can often diagnose this condition by:  · Performing a physical exam to find other diseases or specific problems with the penis.  · Asking you detailed questions about the problem.  · Performing blood tests to check for diabetes or to measure hormone levels.  · Performing urine tests to find other underlying health  conditions.  · Performing an ultrasound to check for scarring.  · Performing a test to check blood flow to the penis.  · Doing a sleep study at home to measure nighttime erections.  TREATMENT   · You may be prescribed medicines by mouth.  · You may be given medicine injections into the penis.  · You may be prescribed a vacuum pump with a ring.  · Penile implant surgery may be performed. You may receive:  · An inflatable implant.  · A semi-rigid implant.  · Blood vessel surgery may be performed.  HOME CARE INSTRUCTIONS  · Take all medicine as directed by your caregiver. Do not take any other medicines without talking to your caregiver first.  · Follow your caregiver's directions for specific treatments as prescribed.  · Follow up with your caregiver as directed.  Document Released: 06/14/2000 Document Revised: 09/09/2011 Document Reviewed: 10/07/2010  [employer]® Patient Information ©2013 [employer].

## 2013-05-06 ENCOUNTER — Other Ambulatory Visit: Payer: Self-pay

## 2014-06-21 ENCOUNTER — Encounter: Payer: Self-pay | Admitting: Family Medicine

## 2014-06-21 ENCOUNTER — Ambulatory Visit (INDEPENDENT_AMBULATORY_CARE_PROVIDER_SITE_OTHER): Payer: BC Managed Care – PPO | Admitting: Family Medicine

## 2014-06-21 VITALS — BP 128/76 | HR 73 | Temp 98.0°F | Ht 71.5 in | Wt 173.2 lb

## 2014-06-21 DIAGNOSIS — Z0000 Encounter for general adult medical examination without abnormal findings: Secondary | ICD-10-CM

## 2014-06-21 DIAGNOSIS — Z23 Encounter for immunization: Secondary | ICD-10-CM

## 2014-06-21 LAB — HEPATIC FUNCTION PANEL
ALBUMIN: 4.4 g/dL (ref 3.5–5.2)
ALT: 25 U/L (ref 0–53)
AST: 21 U/L (ref 0–37)
Alkaline Phosphatase: 76 U/L (ref 39–117)
Bilirubin, Direct: 0.2 mg/dL (ref 0.0–0.3)
TOTAL PROTEIN: 7.1 g/dL (ref 6.0–8.3)
Total Bilirubin: 1.4 mg/dL — ABNORMAL HIGH (ref 0.2–1.2)

## 2014-06-21 LAB — CBC WITH DIFFERENTIAL/PLATELET
BASOS PCT: 0.7 % (ref 0.0–3.0)
Basophils Absolute: 0 10*3/uL (ref 0.0–0.1)
EOS ABS: 0.1 10*3/uL (ref 0.0–0.7)
Eosinophils Relative: 1.3 % (ref 0.0–5.0)
HCT: 47.9 % (ref 39.0–52.0)
HEMOGLOBIN: 15.9 g/dL (ref 13.0–17.0)
Lymphocytes Relative: 18.3 % (ref 12.0–46.0)
Lymphs Abs: 1.2 10*3/uL (ref 0.7–4.0)
MCHC: 33.2 g/dL (ref 30.0–36.0)
MCV: 91.2 fl (ref 78.0–100.0)
MONO ABS: 0.4 10*3/uL (ref 0.1–1.0)
Monocytes Relative: 6.4 % (ref 3.0–12.0)
NEUTROS ABS: 4.7 10*3/uL (ref 1.4–7.7)
NEUTROS PCT: 73.3 % (ref 43.0–77.0)
Platelets: 183 10*3/uL (ref 150.0–400.0)
RBC: 5.26 Mil/uL (ref 4.22–5.81)
RDW: 13.1 % (ref 11.5–15.5)
WBC: 6.4 10*3/uL (ref 4.0–10.5)

## 2014-06-21 LAB — POCT URINALYSIS DIPSTICK
BILIRUBIN UA: NEGATIVE
Blood, UA: NEGATIVE
Glucose, UA: NEGATIVE
KETONES UA: NEGATIVE
LEUKOCYTES UA: NEGATIVE
Nitrite, UA: NEGATIVE
Protein, UA: NEGATIVE
Spec Grav, UA: 1.025
Urobilinogen, UA: 0.2
pH, UA: 7

## 2014-06-21 LAB — PSA: PSA: 1.17 ng/mL (ref 0.10–4.00)

## 2014-06-21 LAB — LIPID PANEL
Cholesterol: 213 mg/dL — ABNORMAL HIGH (ref 0–200)
HDL: 59.1 mg/dL (ref >39.00)
LDL Cholesterol: 134 mg/dL — ABNORMAL HIGH (ref 0–99)
NonHDL: 153.9
TRIGLYCERIDES: 101 mg/dL (ref 0.0–149.0)
Total CHOL/HDL Ratio: 4
VLDL: 20.2 mg/dL (ref 0.0–40.0)

## 2014-06-21 LAB — BASIC METABOLIC PANEL
BUN: 16 mg/dL (ref 6–23)
CHLORIDE: 103 meq/L (ref 96–112)
CO2: 28 meq/L (ref 19–32)
CREATININE: 1 mg/dL (ref 0.4–1.5)
Calcium: 9.4 mg/dL (ref 8.4–10.5)
GFR: 83.07 mL/min (ref >60.00)
GLUCOSE: 82 mg/dL (ref 70–99)
Potassium: 4.1 mEq/L (ref 3.5–5.1)
Sodium: 138 mEq/L (ref 135–145)

## 2014-06-21 LAB — TSH: TSH: 1.21 u[IU]/mL (ref 0.35–4.50)

## 2014-06-21 NOTE — Patient Instructions (Signed)
Preventive Care for Adults A healthy lifestyle and preventive care can promote health and wellness. Preventive health guidelines for men include the following key practices:  A routine yearly physical is a good way to check with your health care provider about your health and preventative screening. It is a chance to share any concerns and updates on your health and to receive a thorough exam.  Visit your dentist for a routine exam and preventative care every 6 months. Brush your teeth twice a day and floss once a day. Good oral hygiene prevents tooth decay and gum disease.  The frequency of eye exams is based on your age, health, family medical history, use of contact lenses, and other factors. Follow your health care provider's recommendations for frequency of eye exams.  Eat a healthy diet. Foods such as vegetables, fruits, whole grains, low-fat dairy products, and lean protein foods contain the nutrients you need without too many calories. Decrease your intake of foods high in solid fats, added sugars, and salt. Eat the right amount of calories for you.Get information about a proper diet from your health care provider, if necessary.  Regular physical exercise is one of the most important things you can do for your health. Most adults should get at least 150 minutes of moderate-intensity exercise (any activity that increases your heart rate and causes you to sweat) each week. In addition, most adults need muscle-strengthening exercises on 2 or more days a week.  Maintain a healthy weight. The body mass index (BMI) is a screening tool to identify possible weight problems. It provides an estimate of body fat based on height and weight. Your health care provider can find your BMI and can help you achieve or maintain a healthy weight.For adults 20 years and older:  A BMI below 18.5 is considered underweight.  A BMI of 18.5 to 24.9 is normal.  A BMI of 25 to 29.9 is considered overweight.  A BMI  of 30 and above is considered obese.  Maintain normal blood lipids and cholesterol levels by exercising and minimizing your intake of saturated fat. Eat a balanced diet with plenty of fruit and vegetables. Blood tests for lipids and cholesterol should begin at age 50 and be repeated every 5 years. If your lipid or cholesterol levels are high, you are over 50, or you are at high risk for heart disease, you may need your cholesterol levels checked more frequently.Ongoing high lipid and cholesterol levels should be treated with medicines if diet and exercise are not working.  If you smoke, find out from your health care provider how to quit. If you do not use tobacco, do not start.  Lung cancer screening is recommended for adults aged 73-80 years who are at high risk for developing lung cancer because of a history of smoking. A yearly low-dose CT scan of the lungs is recommended for people who have at least a 30-pack-year history of smoking and are a current smoker or have quit within the past 15 years. A pack year of smoking is smoking an average of 1 pack of cigarettes a day for 1 year (for example: 1 pack a day for 30 years or 2 packs a day for 15 years). Yearly screening should continue until the smoker has stopped smoking for at least 15 years. Yearly screening should be stopped for people who develop a health problem that would prevent them from having lung cancer treatment.  If you choose to drink alcohol, do not have more than  2 drinks per day. One drink is considered to be 12 ounces (355 mL) of beer, 5 ounces (148 mL) of wine, or 1.5 ounces (44 mL) of liquor.  Avoid use of street drugs. Do not share needles with anyone. Ask for help if you need support or instructions about stopping the use of drugs.  High blood pressure causes heart disease and increases the risk of stroke. Your blood pressure should be checked at least every 1-2 years. Ongoing high blood pressure should be treated with  medicines, if weight loss and exercise are not effective.  If you are 45-79 years old, ask your health care provider if you should take aspirin to prevent heart disease.  Diabetes screening involves taking a blood sample to check your fasting blood sugar level. This should be done once every 3 years, after age 45, if you are within normal weight and without risk factors for diabetes. Testing should be considered at a younger age or be carried out more frequently if you are overweight and have at least 1 risk factor for diabetes.  Colorectal cancer can be detected and often prevented. Most routine colorectal cancer screening begins at the age of 50 and continues through age 75. However, your health care provider may recommend screening at an earlier age if you have risk factors for colon cancer. On a yearly basis, your health care provider may provide home test kits to check for hidden blood in the stool. Use of a small camera at the end of a tube to directly examine the colon (sigmoidoscopy or colonoscopy) can detect the earliest forms of colorectal cancer. Talk to your health care provider about this at age 50, when routine screening begins. Direct exam of the colon should be repeated every 5-10 years through age 75, unless early forms of precancerous polyps or small growths are found.  People who are at an increased risk for hepatitis B should be screened for this virus. You are considered at high risk for hepatitis B if:  You were born in a country where hepatitis B occurs often. Talk with your health care provider about which countries are considered high risk.  Your parents were born in a high-risk country and you have not received a shot to protect against hepatitis B (hepatitis B vaccine).  You have HIV or AIDS.  You use needles to inject street drugs.  You live with, or have sex with, someone who has hepatitis B.  You are a man who has sex with other men (MSM).  You get hemodialysis  treatment.  You take certain medicines for conditions such as cancer, organ transplantation, and autoimmune conditions.  Hepatitis C blood testing is recommended for all people born from 1945 through 1965 and any individual with known risks for hepatitis C.  Practice safe sex. Use condoms and avoid high-risk sexual practices to reduce the spread of sexually transmitted infections (STIs). STIs include gonorrhea, chlamydia, syphilis, trichomonas, herpes, HPV, and human immunodeficiency virus (HIV). Herpes, HIV, and HPV are viral illnesses that have no cure. They can result in disability, cancer, and death.  If you are at risk of being infected with HIV, it is recommended that you take a prescription medicine daily to prevent HIV infection. This is called preexposure prophylaxis (PrEP). You are considered at risk if:  You are a man who has sex with other men (MSM) and have other risk factors.  You are a heterosexual man, are sexually active, and are at increased risk for HIV infection.    You take drugs by injection.  You are sexually active with a partner who has HIV.  Talk with your health care provider about whether you are at high risk of being infected with HIV. If you choose to begin PrEP, you should first be tested for HIV. You should then be tested every 3 months for as long as you are taking PrEP.  A one-time screening for abdominal aortic aneurysm (AAA) and surgical repair of large AAAs by ultrasound are recommended for men ages 32 to 67 years who are current or former smokers.  Healthy men should no longer receive prostate-specific antigen (PSA) blood tests as part of routine cancer screening. Talk with your health care provider about prostate cancer screening.  Testicular cancer screening is not recommended for adult males who have no symptoms. Screening includes self-exam, a health care provider exam, and other screening tests. Consult with your health care provider about any symptoms  you have or any concerns you have about testicular cancer.  Use sunscreen. Apply sunscreen liberally and repeatedly throughout the day. You should seek shade when your shadow is shorter than you. Protect yourself by wearing long sleeves, pants, a wide-brimmed hat, and sunglasses year round, whenever you are outdoors.  Once a month, do a whole-body skin exam, using a mirror to look at the skin on your back. Tell your health care provider about new moles, moles that have irregular borders, moles that are larger than a pencil eraser, or moles that have changed in shape or color.  Stay current with required vaccines (immunizations).  Influenza vaccine. All adults should be immunized every year.  Tetanus, diphtheria, and acellular pertussis (Td, Tdap) vaccine. An adult who has not previously received Tdap or who does not know his vaccine status should receive 1 dose of Tdap. This initial dose should be followed by tetanus and diphtheria toxoids (Td) booster doses every 10 years. Adults with an unknown or incomplete history of completing a 3-dose immunization series with Td-containing vaccines should begin or complete a primary immunization series including a Tdap dose. Adults should receive a Td booster every 10 years.  Varicella vaccine. An adult without evidence of immunity to varicella should receive 2 doses or a second dose if he has previously received 1 dose.  Human papillomavirus (HPV) vaccine. Males aged 68-21 years who have not received the vaccine previously should receive the 3-dose series. Males aged 22-26 years may be immunized. Immunization is recommended through the age of 6 years for any male who has sex with males and did not get any or all doses earlier. Immunization is recommended for any person with an immunocompromised condition through the age of 49 years if he did not get any or all doses earlier. During the 3-dose series, the second dose should be obtained 4-8 weeks after the first  dose. The third dose should be obtained 24 weeks after the first dose and 16 weeks after the second dose.  Zoster vaccine. One dose is recommended for adults aged 50 years or older unless certain conditions are present.  Measles, mumps, and rubella (MMR) vaccine. Adults born before [birth year] generally are considered immune to measles and mumps. Adults born in [birth year] or later should have 1 or more doses of MMR vaccine unless there is a contraindication to the vaccine or there is laboratory evidence of immunity to each of the three diseases. A routine second dose of MMR vaccine should be obtained at least 28 days after the first dose for students attending postsecondary  schools, health care workers, or international travelers. People who received inactivated measles vaccine or an unknown type of measles vaccine during 1963-1967 should receive 2 doses of MMR vaccine. People who received inactivated mumps vaccine or an unknown type of mumps vaccine before 1979 and are at high risk for mumps infection should consider immunization with 2 doses of MMR vaccine. Unvaccinated health care workers born before 1957 who lack laboratory evidence of measles, mumps, or rubella immunity or laboratory confirmation of disease should consider measles and mumps immunization with 2 doses of MMR vaccine or rubella immunization with 1 dose of MMR vaccine.  Pneumococcal 13-valent conjugate (PCV13) vaccine. When indicated, a person who is uncertain of his immunization history and has no record of immunization should receive the PCV13 vaccine. An adult aged 19 years or older who has certain medical conditions and has not been previously immunized should receive 1 dose of PCV13 vaccine. This PCV13 should be followed with a dose of pneumococcal polysaccharide (PPSV23) vaccine. The PPSV23 vaccine dose should be obtained at least 8 weeks after the dose of PCV13 vaccine. An adult aged 19 years or older who has certain medical conditions and  previously received 1 or more doses of PPSV23 vaccine should receive 1 dose of PCV13. The PCV13 vaccine dose should be obtained 1 or more years after the last PPSV23 vaccine dose.  Pneumococcal polysaccharide (PPSV23) vaccine. When PCV13 is also indicated, PCV13 should be obtained first. All adults aged 65 years and older should be immunized. An adult younger than age 65 years who has certain medical conditions should be immunized. Any person who resides in a nursing home or long-term care facility should be immunized. An adult smoker should be immunized. People with an immunocompromised condition and certain other conditions should receive both PCV13 and PPSV23 vaccines. People with human immunodeficiency virus (HIV) infection should be immunized as soon as possible after diagnosis. Immunization during chemotherapy or radiation therapy should be avoided. Routine use of PPSV23 vaccine is not recommended for American Indians, [place] Natives, or people younger than 65 years unless there are medical conditions that require PPSV23 vaccine. When indicated, people who have unknown immunization and have no record of immunization should receive PPSV23 vaccine. One-time revaccination 5 years after the first dose of PPSV23 is recommended for people aged 19-64 years who have chronic kidney failure, nephrotic syndrome, asplenia, or immunocompromised conditions. People who received 1-2 doses of PPSV23 before age 65 years should receive another dose of PPSV23 vaccine at age 65 years or later if at least 5 years have passed since the previous dose. Doses of PPSV23 are not needed for people immunized with PPSV23 at or after age 65 years.  Meningococcal vaccine. Adults with asplenia or persistent complement component deficiencies should receive 2 doses of quadrivalent meningococcal conjugate (MenACWY-D) vaccine. The doses should be obtained at least 2 months apart. Microbiologists working with certain meningococcal bacteria,  military recruits, people at risk during an outbreak, and people who travel to or live in countries with a high rate of meningitis should be immunized. A first-year college student up through age 21 years who is living in a residence hall should receive a dose if he did not receive a dose on or after his 16th birthday. Adults who have certain high-risk conditions should receive one or more doses of vaccine.  Hepatitis A vaccine. Adults who wish to be protected from this disease, have certain high-risk conditions, work with hepatitis A-infected animals, work in hepatitis A research labs, or   travel to or work in countries with a high rate of hepatitis A should be immunized. Adults who were previously unvaccinated and who anticipate close contact with an international adoptee during the first 60 days after arrival in the [place] from a country with a high rate of hepatitis A should be immunized.  Hepatitis B vaccine. Adults should be immunized if they wish to be protected from this disease, have certain high-risk conditions, may be exposed to blood or other infectious body fluids, are household contacts or sex partners of hepatitis B positive people, are clients or workers in certain care facilities, or travel to or work in countries with a high rate of hepatitis B.  Haemophilus influenzae type b (Hib) vaccine. A previously unvaccinated person with asplenia or sickle cell disease or having a scheduled splenectomy should receive 1 dose of Hib vaccine. Regardless of previous immunization, a recipient of a hematopoietic stem cell transplant should receive a 3-dose series 6-12 months after his successful transplant. Hib vaccine is not recommended for adults with HIV infection. Preventive Service / Frequency Ages 52 to 17  Blood pressure check.** / Every 1 to 2 years.  Lipid and cholesterol check.** / Every 5 years beginning at age 69.  Hepatitis C blood test.** / For any individual with known risks for  hepatitis C.  Skin self-exam. / Monthly.  Influenza vaccine. / Every year.  Tetanus, diphtheria, and acellular pertussis (Tdap, Td) vaccine.** / Consult your health care provider. 1 dose of Td every 10 years.  Varicella vaccine.** / Consult your health care provider.  HPV vaccine. / 3 doses over 6 months, if 72 or younger.  Measles, mumps, rubella (MMR) vaccine.** / You need at least 1 dose of MMR if you were born in 1957 or later. You may also need a second dose.  Pneumococcal 13-valent conjugate (PCV13) vaccine.** / Consult your health care provider.  Pneumococcal polysaccharide (PPSV23) vaccine.** / 1 to 2 doses if you smoke cigarettes or if you have certain conditions.  Meningococcal vaccine.** / 1 dose if you are age 35 to 60 years and a Market researcher living in a residence hall, or have one of several medical conditions. You may also need additional booster doses.  Hepatitis A vaccine.** / Consult your health care provider.  Hepatitis B vaccine.** / Consult your health care provider.  Haemophilus influenzae type b (Hib) vaccine.** / Consult your health care provider. Ages 35 to 8  Blood pressure check.** / Every 1 to 2 years.  Lipid and cholesterol check.** / Every 5 years beginning at age 57.  Lung cancer screening. / Every year if you are aged 44-80 years and have a 30-pack-year history of smoking and currently smoke or have quit within the past 15 years. Yearly screening is stopped once you have quit smoking for at least 15 years or develop a health problem that would prevent you from having lung cancer treatment.  Fecal occult blood test (FOBT) of stool. / Every year beginning at age 55 and continuing until age 73. You may not have to do this test if you get a colonoscopy every 10 years.  Flexible sigmoidoscopy** or colonoscopy.** / Every 5 years for a flexible sigmoidoscopy or every 10 years for a colonoscopy beginning at age 28 and continuing until age  1.  Hepatitis C blood test.** / For all people born from [birth year] through 1965 and any individual with known risks for hepatitis C.  Skin self-exam. / Monthly.  Influenza vaccine. / Every  year.  Tetanus, diphtheria, and acellular pertussis (Tdap/Td) vaccine.** / Consult your health care provider. 1 dose of Td every 10 years.  Varicella vaccine.** / Consult your health care provider.  Zoster vaccine.** / 1 dose for adults aged 53 years or older.  Measles, mumps, rubella (MMR) vaccine.** / You need at least 1 dose of MMR if you were born in 1957 or later. You may also need a second dose.  Pneumococcal 13-valent conjugate (PCV13) vaccine.** / Consult your health care provider.  Pneumococcal polysaccharide (PPSV23) vaccine.** / 1 to 2 doses if you smoke cigarettes or if you have certain conditions.  Meningococcal vaccine.** / Consult your health care provider.  Hepatitis A vaccine.** / Consult your health care provider.  Hepatitis B vaccine.** / Consult your health care provider.  Haemophilus influenzae type b (Hib) vaccine.** / Consult your health care provider. Ages 77 and over  Blood pressure check.** / Every 1 to 2 years.  Lipid and cholesterol check.**/ Every 5 years beginning at age 85.  Lung cancer screening. / Every year if you are aged 55-80 years and have a 30-pack-year history of smoking and currently smoke or have quit within the past 15 years. Yearly screening is stopped once you have quit smoking for at least 15 years or develop a health problem that would prevent you from having lung cancer treatment.  Fecal occult blood test (FOBT) of stool. / Every year beginning at age 33 and continuing until age 11. You may not have to do this test if you get a colonoscopy every 10 years.  Flexible sigmoidoscopy** or colonoscopy.** / Every 5 years for a flexible sigmoidoscopy or every 10 years for a colonoscopy beginning at age 28 and continuing until age 73.  Hepatitis C blood  test.** / For all people born from [birth year] through 1965 and any individual with known risks for hepatitis C.  Abdominal aortic aneurysm (AAA) screening.** / A one-time screening for ages 50 to 27 years who are current or former smokers.  Skin self-exam. / Monthly.  Influenza vaccine. / Every year.  Tetanus, diphtheria, and acellular pertussis (Tdap/Td) vaccine.** / 1 dose of Td every 10 years.  Varicella vaccine.** / Consult your health care provider.  Zoster vaccine.** / 1 dose for adults aged 34 years or older.  Pneumococcal 13-valent conjugate (PCV13) vaccine.** / Consult your health care provider.  Pneumococcal polysaccharide (PPSV23) vaccine.** / 1 dose for all adults aged 63 years and older.  Meningococcal vaccine.** / Consult your health care provider.  Hepatitis A vaccine.** / Consult your health care provider.  Hepatitis B vaccine.** / Consult your health care provider.  Haemophilus influenzae type b (Hib) vaccine.** / Consult your health care provider. **Family history and personal history of risk and conditions may change your health care provider's recommendations. Document Released: 08/13/2001 Document Revised: 06/22/2013 Document Reviewed: 11/12/2010 [facility] Patient Information 2015 [place], [place]. This information is not intended to replace advice given to you by your health care provider. Make sure you discuss any questions you have with your health care provider.

## 2014-06-21 NOTE — Progress Notes (Signed)
Subjective:    Patient ID: Joel Gallegos, male    DOB: 01/14/1966, 48 y.o.   MRN: [id number]    HPI  Pt here for cpe and labs.  No complaints.    History reviewed. No pertinent past medical history. History reviewed. No pertinent family history. History   Social History  . Marital Status: Married    Spouse Name: N/A    Number of Children: N/A  . Years of Education: N/A   Occupational History  . Not on file.   Social History Main Topics  . Smoking status: Never Smoker   . Smokeless tobacco: Not on file  . Alcohol Use: Yes  . Drug Use: No  . Sexual Activity:    Partners: Female   Other Topics Concern  . Not on file   Social History Narrative   Exercise--no   Current Outpatient Prescriptions  Medication Sig Dispense Refill  . Desoximetasone (TOPICORT) 0.25 % ointment Apply 1 application topically 2 (two) times daily. 60 g 5   No current facility-administered medications for this visit.     Review of Systems Review of Systems  Constitutional: Negative for activity change, appetite change and fatigue.  HENT: Negative for hearing loss, congestion, tinnitus and ear discharge.  dentist q42m Eyes: Negative for visual disturbance (see optho q1y -- vision corrected to 20/20 with glasses).  Respiratory: Negative for cough, chest tightness and shortness of breath.   Cardiovascular: Negative for chest pain, palpitations and leg swelling.  Gastrointestinal: Negative for abdominal pain, diarrhea, constipation and abdominal distention.  Genitourinary: Negative for urgency, frequency, decreased urine volume and difficulty urinating.  Musculoskeletal: Negative for back pain, arthralgias and gait problem.  Skin: Negative for color change, pallor and rash.  Neurological: Negative for dizziness, light-headedness, numbness and headaches.  Hematological: Negative for adenopathy. Does not bruise/bleed easily.  Psychiatric/Behavioral: Negative for suicidal ideas, confusion, sleep  disturbance, self-injury, dysphoric mood, decreased concentration and agitation.         Objective:   Physical Exam  BP 128/76 mmHg  Pulse 73  Temp(Src) 98 F (36.7 C) (Oral)  Ht 5' 11.5" (1.816 m)  Wt 173 lb 3.2 oz (78.563 kg)  BMI 23.82 kg/m2  SpO2 98% General appearance: alert, cooperative, appears stated age and no distress Head: Normocephalic, without obvious abnormality, atraumatic Eyes: conjunctivae/corneas clear. PERRL, EOM's intact. Fundi benign. Ears: normal TM's and external ear canals both ears Nose: Nares normal. Septum midline. Mucosa normal. No drainage or sinus tenderness. Throat: lips, mucosa, and tongue normal; teeth and gums normal Neck: no adenopathy, no carotid bruit, no JVD, supple, symmetrical, trachea midline and thyroid not enlarged, symmetric, no tenderness/mass/nodules Back: symmetric, no curvature. ROM normal. No CVA tenderness. Lungs: clear to auscultation bilaterally Chest wall: no tenderness Heart: regular rate and rhythm, S1, S2 normal, no murmur, click, rub or gallop Abdomen: soft, non-tender; bowel sounds normal; no masses,  no organomegaly Male genitalia: normal, penis: no lesions or discharge. testes: no masses or tenderness. no hernias Rectal: normal tone, normal prostate, no masses or tenderness and soft brown guaiac negative stool noted Extremities: extremities normal, atraumatic, no cyanosis or edema Pulses: 2+ and symmetric Skin: Skin color, texture, turgor normal. No rashes or lesions Lymph nodes: Cervical, supraclavicular, and axillary nodes normal. Neurologic: Alert and oriented X 3, normal strength and tone. Normal symmetric reflexes. Normal coordination and gait Psych--no depression, no anxiety       Assessment & Plan:  1. Preventative health care Check labs ghm utd See AVS -  Basic metabolic panel - CBC with Differential - Hepatic function panel - Lipid panel - POCT urinalysis dipstick - PSA - TSH  2. Need for Tdap  vaccination   - Tdap vaccine greater than or equal to 7yo IM

## 2014-06-21 NOTE — Progress Notes (Signed)
Pre visit review using our clinic review tool, if applicable. No additional management support is needed unless otherwise documented below in the visit note. 

## 2014-06-22 ENCOUNTER — Encounter: Payer: Self-pay | Admitting: Family Medicine

## 2016-01-29 DIAGNOSIS — S52121D Displaced fracture of head of right radius, subsequent encounter for closed fracture with routine healing: Secondary | ICD-10-CM | POA: Insufficient documentation

## 2016-01-29 DIAGNOSIS — S66912A Strain of unspecified muscle, fascia and tendon at wrist and hand level, left hand, initial encounter: Secondary | ICD-10-CM | POA: Insufficient documentation

## 2016-03-05 ENCOUNTER — Encounter (HOSPITAL_BASED_OUTPATIENT_CLINIC_OR_DEPARTMENT_OTHER): Payer: Self-pay | Admitting: Emergency Medicine

## 2016-03-05 ENCOUNTER — Emergency Department (HOSPITAL_BASED_OUTPATIENT_CLINIC_OR_DEPARTMENT_OTHER)
Admission: EM | Admit: 2016-03-05 | Discharge: 2016-03-05 | Disposition: A | Discharge status: Home / Self Care | Payer: Worker's Compensation | Attending: Emergency Medicine | Admitting: Emergency Medicine

## 2016-03-05 DIAGNOSIS — Y990 Civilian activity done for income or pay: Secondary | ICD-10-CM | POA: Insufficient documentation

## 2016-03-05 DIAGNOSIS — S0181XA Laceration without foreign body of other part of head, initial encounter: Secondary | ICD-10-CM | POA: Insufficient documentation

## 2016-03-05 DIAGNOSIS — S0990XA Unspecified injury of head, initial encounter: Secondary | ICD-10-CM | POA: Diagnosis present

## 2016-03-05 DIAGNOSIS — W228XXA Striking against or struck by other objects, initial encounter: Secondary | ICD-10-CM | POA: Diagnosis not present

## 2016-03-05 DIAGNOSIS — Y939 Activity, unspecified: Secondary | ICD-10-CM | POA: Diagnosis not present

## 2016-03-05 DIAGNOSIS — Y929 Unspecified place or not applicable: Secondary | ICD-10-CM | POA: Diagnosis not present

## 2016-03-05 MED ORDER — LIDOCAINE-EPINEPHRINE 2 %-1:100000 IJ SOLN
INTRAMUSCULAR | Status: AC
  Filled 2016-03-05: qty 1

## 2016-03-05 NOTE — ED Provider Notes (Signed)
[facility] Provider Note   CSN: [id number] Arrival date & time: 03/05/16  2022  By signing my name below, I, Jeanell Sparrow, attest that this documentation has been prepared under the direction and in the presence of Shanon Rosser, MD . Electronically Signed: Jeanell Sparrow, Scribe. 03/05/2016. 11:02 PM.  History   Chief Complaint Chief Complaint  Patient presents with  . Head Laceration   The history is provided by the patient. No language interpreter was used.   HPI Comments: Joel Gallegos is a 50 y.o. male who presents to the Emergency Department complaining of a right forehead injury that occurred Just prior to arrival. Pt states he hit and wounded his head on a sharp airplane part while working. Denies any LOC, neck pain, nausea or vomiting. Pain is minimal. Bleeding has been controlled with pressure. Tetanus status is UTD.   History reviewed. No pertinent past medical history.  Patient Active Problem List   Diagnosis Date Noted  . Erectile dysfunction 08/24/2012  . DEPRESSIVE DISORDER NOT ELSEWHERE CLASSIFIED 03/02/2010  . NEVI, MULTIPLE 02/27/2009  . HEADACHE 02/27/2009  . EJACULATION, ABNORMAL 02/26/2008    Past Surgical History:  Procedure Laterality Date  . KNEE SURGERY         Home Medications    Prior to Admission medications   Medication Sig Start Date End Date Taking? Authorizing Provider  Desoximetasone (TOPICORT) 0.25 % ointment Apply 1 application topically 2 (two) times daily. 07/21/12   Ann Held, DO    Family History No family history on file.  Social History Social History  Substance Use Topics  . Smoking status: Never Smoker  . Smokeless tobacco: Never Used  . Alcohol use Yes     Allergies   Review of patient's allergies indicates no known allergies.   Review of Systems Review of Systems A complete 10 system review of systems was obtained and all systems are negative except as noted in the HPI and PMH.   Physical  Exam Updated Vital Signs BP 136/91 (BP Location: Left Arm)   Pulse 74   Temp 98.1 F (36.7 C) (Oral)   Resp 18   Ht 6' (1.829 m)   Wt 180 lb (81.6 kg)   SpO2 99%   BMI 24.41 kg/m   Physical Exam  Nursing note and vitals reviewed. General: Well-developed, well-nourished male in no acute distress; appearance consistent with age of record HENT: normocephalic; laceration to right forehead Eyes: pupils equal, round and reactive to light; extraocular muscles intact Neck: supple; no C-spine tenderness Heart: regular rate and rhythm Lungs: clear to auscultation bilaterally Abdomen: soft; nondistended; nontender; bowel sounds present Extremities: No deformity; full range of motion; pulses normal Neurologic: Awake, alert and oriented; motor function intact in all extremities and symmetric; no facial droop Skin: Warm and dry Psychiatric: Normal mood and affect   ED Treatments / Results  Nursing notes and vitals signs, including pulse oximetry, reviewed.  Procedures (including critical care time)  LACERATION REPAIR Performed by: Ahniyah Giancola L Authorized by: Wynetta Fines Consent: Verbal consent obtained. Risks and benefits: risks, benefits and alternatives were discussed Consent given by: patient Patient identity confirmed: provided demographic data Prepped and Draped in normal sterile fashion Wound explored  Laceration Location: Right forehead  Laceration Length: 3.5 cm  No Foreign Bodies seen or palpated  Anesthesia: local infiltration  Local anesthetic: lidocaine 2 % with epinephrine  Anesthetic total: 6 ml  Irrigation method: syringe Amount of cleaning: standard  Skin closure: 5-0 nylon  Number of sutures: One   Technique: Running   Patient tolerance: Patient tolerated the procedure well with no immediate complications.      Final Clinical Impressions(s) / ED Diagnoses   Final diagnoses:  Forehead laceration, initial encounter   I personally  performed the services described in this documentation, which was scribed in my presence. The recorded information has been reviewed and is accurate.    Shanon Rosser, MD 03/05/16 [phone]

## 2016-03-05 NOTE — ED Triage Notes (Signed)
Pt hit head on an airplane while working, has laceration to head. Supervisor states no drug drug screen needed. Pt denies any LOC.

## 2016-03-05 NOTE — ED Notes (Signed)
MD at bedside. 

## 2016-03-25 DIAGNOSIS — S52124D Nondisplaced fracture of head of right radius, subsequent encounter for closed fracture with routine healing: Secondary | ICD-10-CM | POA: Diagnosis not present

## 2017-01-26 ENCOUNTER — Emergency Department (HOSPITAL_BASED_OUTPATIENT_CLINIC_OR_DEPARTMENT_OTHER): Payer: BLUE CROSS/BLUE SHIELD

## 2017-01-26 ENCOUNTER — Encounter (HOSPITAL_BASED_OUTPATIENT_CLINIC_OR_DEPARTMENT_OTHER): Payer: Self-pay | Admitting: Emergency Medicine

## 2017-01-26 ENCOUNTER — Emergency Department (HOSPITAL_BASED_OUTPATIENT_CLINIC_OR_DEPARTMENT_OTHER)
Admission: EM | Admit: 2017-01-26 | Discharge: 2017-01-27 | Disposition: A | Discharge status: Home / Self Care | Payer: BLUE CROSS/BLUE SHIELD | Attending: Emergency Medicine | Admitting: Emergency Medicine

## 2017-01-26 DIAGNOSIS — Y93H9 Activity, other involving exterior property and land maintenance, building and construction: Secondary | ICD-10-CM | POA: Diagnosis not present

## 2017-01-26 DIAGNOSIS — S81012A Laceration without foreign body, left knee, initial encounter: Secondary | ICD-10-CM | POA: Diagnosis not present

## 2017-01-26 DIAGNOSIS — S8992XA Unspecified injury of left lower leg, initial encounter: Secondary | ICD-10-CM | POA: Diagnosis present

## 2017-01-26 DIAGNOSIS — Y92018 Other place in single-family (private) house as the place of occurrence of the external cause: Secondary | ICD-10-CM | POA: Diagnosis not present

## 2017-01-26 DIAGNOSIS — W458XXA Other foreign body or object entering through skin, initial encounter: Secondary | ICD-10-CM | POA: Insufficient documentation

## 2017-01-26 DIAGNOSIS — S81011A Laceration without foreign body, right knee, initial encounter: Secondary | ICD-10-CM | POA: Diagnosis not present

## 2017-01-26 DIAGNOSIS — Y999 Unspecified external cause status: Secondary | ICD-10-CM | POA: Insufficient documentation

## 2017-01-26 MED ORDER — LIDOCAINE HCL (PF) 1 % IJ SOLN
10.0000 mL | Freq: Once | INTRAMUSCULAR | Status: AC
Start: 2017-01-26 — End: 2017-01-27
  Administered 2017-01-27: 10 mL via INTRADERMAL
  Filled 2017-01-26: qty 10

## 2017-01-26 NOTE — ED Triage Notes (Signed)
PT presents with c/o laceration to left knee pt sts he turned around in his shed and cut it on jagged piece of metal

## 2017-01-26 NOTE — ED Provider Notes (Signed)
[facility] Provider Note   CSN: [id number] Arrival date & time: 01/26/17  2059  By signing my name below, I, Dora Sims, attest that this documentation has been prepared under the direction and in the presence of Providence Lanius, PA-C. Electronically Signed: Dora Sims, Scribe. 01/26/2017. 11:44 PM.  History   Chief Complaint Chief Complaint  Patient presents with  . Laceration   The history is provided by the patient. No language interpreter was used.    HPI Comments: Joel Gallegos is a 51 y.o. male who presents to the Emergency Department for evaluation of a laceration to the anterior left knee sustained around 8 PM tonight. He was working in his shed and accidentally lacerated the knee on the edge of his toolbox. The wound is hemostatic with an applied pressure dressing. He reports mild pain secondary to the wound. His tetanus status is UTD. Patient does not use blood thinners. He denies numbness/tingling or any other associated symptoms.  History reviewed. No pertinent past medical history.  Patient Active Problem List   Diagnosis Date Noted  . Erectile dysfunction 08/24/2012  . DEPRESSIVE DISORDER NOT ELSEWHERE CLASSIFIED 03/02/2010  . NEVI, MULTIPLE 02/27/2009  . HEADACHE 02/27/2009  . EJACULATION, ABNORMAL 02/26/2008    Past Surgical History:  Procedure Laterality Date  . KNEE SURGERY         Home Medications    Prior to Admission medications   Medication Sig Start Date End Date Taking? Authorizing Provider  Desoximetasone (TOPICORT) 0.25 % ointment Apply 1 application topically 2 (two) times daily. 07/21/12   Ann Held, DO    Family History No family history on file.  Social History Social History  Substance Use Topics  . Smoking status: Never Smoker  . Smokeless tobacco: Never Used  . Alcohol use Yes     Allergies   Patient has no known allergies.   Review of Systems Review of Systems  Skin: Positive for wound.    Neurological: Negative for numbness.  Hematological: Does not bruise/bleed easily.   Physical Exam Updated Vital Signs BP 113/68 (BP Location: Right Arm)   Pulse (!) 55   Temp 97.9 F (36.6 C) (Oral)   Resp 18   SpO2 100%   Physical Exam  Constitutional: He is oriented to person, place, and time. He appears well-developed and well-nourished. No distress.  Sitting comfortably on examination table  HENT:  Head: Normocephalic and atraumatic.  Eyes: Conjunctivae and EOM are normal.  Neck: Neck supple. No tracheal deviation present.  Cardiovascular: Normal rate.   Pulmonary/Chest: Effort normal. No respiratory distress.  Musculoskeletal: Normal range of motion.  No tenderness to palpation to bilateral knees and ankles. No deformities or crepitus noted. FROM of BLE without any difficulty.   Neurological: He is alert and oriented to person, place, and time.  Skin: Skin is warm and dry. Capillary refill takes less than 2 seconds.  1.5 cm laceration to the lateral aspect of the anterior left knee superior to the patella   Psychiatric: He has a normal mood and affect. His behavior is normal.  Nursing note and vitals reviewed.  ED Treatments / Results  Labs (all labs ordered are listed, but only abnormal results are displayed) Labs Reviewed - No data to display  EKG  EKG Interpretation None       Radiology Dg Knee 2 Views Left  Result Date: 01/27/2017 CLINICAL DATA:  Laceration. EXAM: LEFT KNEE - 1-2 VIEW COMPARISON:  None. FINDINGS: No evidence of  fracture, dislocation, or joint effusion. No evidence of arthropathy or other focal bone abnormality. Soft tissue swelling anterior to the patella. No radiopaque foreign body seen. IMPRESSION: Soft tissue swelling anterior to the patella. No radiopaque foreign body seen. Electronically Signed   By: Fidela Salisbury M.D.   On: 01/27/2017 00:12    Procedures .Marland KitchenLaceration Repair Date/Time: 01/27/2017 1:26 AM Performed by: Providence Lanius A Authorized by: Dorie Rank   Consent:    Consent obtained:  Verbal   Consent given by:  Patient   Risks discussed:  Infection and pain Anesthesia (see MAR for exact dosages):    Anesthesia method:  Local infiltration   Local anesthetic:  Lidocaine 1% w/o epi Laceration details:    Location:  Leg   Leg location:  R knee   Length (cm):  1.5 Pre-procedure details:    Preparation:  Patient was prepped and draped in usual sterile fashion Exploration:    Hemostasis achieved with:  Direct pressure   Contaminated: no   Treatment:    Area cleansed with:  Betadine and saline   Amount of cleaning:  Extensive   Irrigation solution:  Sterile saline   Irrigation method:  Syringe   Visualized foreign bodies/material removed: no   Skin repair:    Repair method:  Sutures   Suture size:  3-0   Suture material:  Prolene   Number of sutures:  3 Approximation:    Vermilion border: well-aligned   Post-procedure details:    Dressing:  Sterile dressing and antibiotic ointment   Patient tolerance of procedure:  Tolerated well, no immediate complications   (including critical care time)  DIAGNOSTIC STUDIES: Oxygen Saturation is 100% on RA, normal by my interpretation.    COORDINATION OF CARE: 11:25 PM Discussed treatment plan with pt at bedside and pt agreed to plan.  Medications Ordered in ED Medications  lidocaine (PF) (XYLOCAINE) 1 % injection 10 mL (10 mLs Intradermal Given by Other 01/27/17 0030)     Initial Impression / Assessment and Plan / ED Course  I have reviewed the triage vital signs and the nursing notes.  Pertinent labs & imaging results that were available during my care of the patient were reviewed by me and considered in my medical decision making (see chart for details).      51 year old male who presents with knee laceration that occurred earlier this evening. Patient is afebrile, non-toxic appearing, sitting comfortably on examination table. Vital signs  reviewed and stable. Patient is neurovascularly intact. Tetanus UTD. Laceration occurred < 12 hours prior to repair. Will obtain XR since it is overlying the knee. Plan to repair in the department.   XR reviewed. Negative for any fracture. Plan to repair.   Discussed laceration care with pt and answered questions. Pt to f-u for suture removal in 7 days and wound check sooner should there be signs of dehiscence or infection. Return precautions discussed. Patient expresses understanding and agreement to plan.    Final Clinical Impressions(s) / ED Diagnoses   Final diagnoses:  Laceration of right knee, initial encounter    New Prescriptions Discharge Medication List as of 01/27/2017  1:15 AM     I personally performed the services described in this documentation, which was scribed in my presence. The recorded information has been reviewed and is accurate.    Volanda Napoleon, PA-C 01/27/17 1620    Dorie Rank, MD 01/28/17 1934

## 2017-01-26 NOTE — ED Notes (Signed)
Pt back from XR 

## 2017-01-27 NOTE — Discharge Instructions (Signed)
Keep the wound clean and dry for the first 24 hours.   Keep wound and clean with mild soap and water and covered with a topical antibiotic ointment and bandage.   Ice and elevate for additional pain relief. Alternate between Ibuprofen and Tylenol for additional pain relief.   Follow up with your primary care doctor or the ED in approximately 7 days for wound recheck and suture removal. Monitor  for signs of infection to include but not limited to increasing pain, redness, drainage, or swelling. Return to emergency department for emergent changing or worsening symptoms.

## 2017-01-27 NOTE — ED Notes (Signed)
ED Provider at bedside. 

## 2017-02-02 NOTE — Progress Notes (Signed)
[facility] [address], [place], [place] [address] [phone]  Date:  02/03/2017   Name:  Joel Gallegos   DOB:  02-20-1966   MRN:  [id number]  PCP:  Ann Held, DO    Chief Complaint: Suture / Staple Removal (Pt here to have stitches removed from knee. )   History of Present Illness:  Joel Gallegos is a 51 y.o. very pleasant male patient who presents with the following:  Sutures placed to his knee in the ER on 7/29- 8 days ago.  He cut his knee on a sharp toolbox edge and had 3 sutures placed Last tetanus: 2015 The wound feels fine, seems to be healing well No fever, pus, or redness He also notes that he was told his pulse ws in the 40s when he was in the ER.  He had not noted any sx of bradycardia except sometimes he does feel tired in the evenings He is not a long distance athlete No history of syncope or pre-syncope that he has noted He is not on any medications  Pulse Readings from Last 3 Encounters:  02/03/17 (!) 56  01/27/17 (!) 55  03/05/16 (!) 56     Patient Active Problem List   Diagnosis Date Noted  . Erectile dysfunction 08/24/2012  . DEPRESSIVE DISORDER NOT ELSEWHERE CLASSIFIED 03/02/2010  . NEVI, MULTIPLE 02/27/2009  . HEADACHE 02/27/2009  . EJACULATION, ABNORMAL 02/26/2008    No past medical history on file.  Past Surgical History:  Procedure Laterality Date  . KNEE SURGERY      Social History  Substance Use Topics  . Smoking status: Never Smoker  . Smokeless tobacco: Never Used  . Alcohol use Yes    No family history on file.  No Known Allergies  Medication list has been reviewed and updated.  Current Outpatient Prescriptions on File Prior to Visit  Medication Sig Dispense Refill  . Desoximetasone (TOPICORT) 0.25 % ointment Apply 1 application topically 2 (two) times daily. 60 g 5   No current facility-administered medications on file prior to visit.     Review  of Systems:  As per HPI- otherwise negative. No CP or SOB No fever or chills   Physical Examination: Vitals:   02/03/17 1117  BP: 118/82  Pulse: (!) 56  Temp: 97.8 F (36.6 C)   Vitals:   02/03/17 1117  Weight: 175 lb 12.8 oz (79.7 kg)  Height: 5' 11.5" (1.816 m)   Body mass index is 24.18 kg/m. Ideal Body Weight: Weight in (lb) to have BMI = 25: 181.4   GEN: WDWN, NAD, Non-toxic, A & O x 3 HEENT: Atraumatic, Normocephalic. Neck supple. No masses, No LAD. Ears and Nose: No external deformity. CV: RRR with mild bradycardia, No M/G/R. No JVD. No thrill. No extra heart sounds. PULM: CTA B, no wheezes, crackles, rhonchi. No retractions. No resp. distress. No accessory muscle use. EXTR: No c/c/e NEURO Normal gait.  PSYCH: Normally interactive. Conversant. Not depressed or anxious appearing.  Calm demeanor.   Left knee: removed 3 SI sutures from his left knee Wound is well healed, no sign of infection   Assessment and Plan: Visit for suture removal  Bradycardia  Here today for SR and also to discuss bradycardia Wound on his knee looks fine- went over things to watch out for.  He will take it easy on his knee for the next couple of weeks Discussed  brachycardia looking back his pulse has been in the 50s for several years.  I am not able to look at his old EKG tracings although he has had 3 in the past  Reassured that in the absence of symptoms HR in the 50s is generally quite benign He will report if any lower pulse readings (went over how to count his pulse) or if sx develop   Signed Lamar Blinks, MD

## 2017-02-03 ENCOUNTER — Encounter: Payer: Self-pay | Admitting: Family Medicine

## 2017-02-03 ENCOUNTER — Ambulatory Visit (INDEPENDENT_AMBULATORY_CARE_PROVIDER_SITE_OTHER): Payer: BLUE CROSS/BLUE SHIELD | Admitting: Family Medicine

## 2017-02-03 VITALS — BP 118/82 | HR 56 | Temp 97.8°F | Ht 71.5 in | Wt 175.8 lb

## 2017-02-03 DIAGNOSIS — Z4802 Encounter for removal of sutures: Secondary | ICD-10-CM | POA: Diagnosis not present

## 2017-02-03 DIAGNOSIS — R001 Bradycardia, unspecified: Secondary | ICD-10-CM | POA: Diagnosis not present

## 2017-02-03 NOTE — Patient Instructions (Signed)
The wound on your knee looks good- I think it will do well. If you have any complications - redness, pus, wound opening- please let us know Your heart rate is a bit slow- looking back this is not new and is likely just how you are made.  However if you have any fainting or near fainting, or if you notice your pulse in the 30s/40s please do let us know!

## 2017-04-17 ENCOUNTER — Encounter: Payer: Self-pay | Admitting: Family Medicine

## 2017-04-17 ENCOUNTER — Ambulatory Visit (INDEPENDENT_AMBULATORY_CARE_PROVIDER_SITE_OTHER): Payer: BLUE CROSS/BLUE SHIELD | Admitting: Family Medicine

## 2017-04-17 VITALS — BP 120/78 | HR 60 | Temp 98.1°F | Ht 72.0 in | Wt 184.0 lb

## 2017-04-17 DIAGNOSIS — Z23 Encounter for immunization: Secondary | ICD-10-CM | POA: Diagnosis not present

## 2017-04-17 DIAGNOSIS — Z0000 Encounter for general adult medical examination without abnormal findings: Secondary | ICD-10-CM

## 2017-04-17 DIAGNOSIS — Z125 Encounter for screening for malignant neoplasm of prostate: Secondary | ICD-10-CM | POA: Diagnosis not present

## 2017-04-17 LAB — COMPREHENSIVE METABOLIC PANEL
ALBUMIN: 4.2 g/dL (ref 3.5–5.2)
ALK PHOS: 79 U/L (ref 39–117)
ALT: 22 U/L (ref 0–53)
AST: 16 U/L (ref 0–37)
BUN: 18 mg/dL (ref 6–23)
CO2: 31 mEq/L (ref 19–32)
Calcium: 9.4 mg/dL (ref 8.4–10.5)
Chloride: 103 mEq/L (ref 96–112)
Creatinine, Ser: 1.05 mg/dL (ref 0.40–1.50)
GFR: 79.4 mL/min (ref >60.00)
Glucose, Bld: 82 mg/dL (ref 70–99)
POTASSIUM: 4.3 meq/L (ref 3.5–5.1)
SODIUM: 139 meq/L (ref 135–145)
TOTAL PROTEIN: 6.7 g/dL (ref 6.0–8.3)
Total Bilirubin: 1 mg/dL (ref 0.2–1.2)

## 2017-04-17 LAB — CBC WITH DIFFERENTIAL/PLATELET
Basophils Absolute: 0 10*3/uL (ref 0.0–0.1)
Basophils Relative: 0.7 % (ref 0.0–3.0)
EOS PCT: 1.6 % (ref 0.0–5.0)
Eosinophils Absolute: 0.1 10*3/uL (ref 0.0–0.7)
HEMATOCRIT: 48.7 % (ref 39.0–52.0)
HEMOGLOBIN: 16.1 g/dL (ref 13.0–17.0)
LYMPHS PCT: 18.3 % (ref 12.0–46.0)
Lymphs Abs: 1 10*3/uL (ref 0.7–4.0)
MCHC: 33.2 g/dL (ref 30.0–36.0)
MCV: 93.6 fl (ref 78.0–100.0)
MONO ABS: 0.4 10*3/uL (ref 0.1–1.0)
MONOS PCT: 7.4 % (ref 3.0–12.0)
Neutro Abs: 4.1 10*3/uL (ref 1.4–7.7)
Neutrophils Relative %: 72 % (ref 43.0–77.0)
Platelets: 172 10*3/uL (ref 150.0–400.0)
RBC: 5.2 Mil/uL (ref 4.22–5.81)
RDW: 13 % (ref 11.5–15.5)
WBC: 5.7 10*3/uL (ref 4.0–10.5)

## 2017-04-17 LAB — LIPID PANEL
Cholesterol: 217 mg/dL — ABNORMAL HIGH (ref 0–200)
HDL: 61.8 mg/dL (ref >39.00)
LDL Cholesterol: 129 mg/dL — ABNORMAL HIGH (ref 0–99)
NonHDL: 155.1
Total CHOL/HDL Ratio: 4
Triglycerides: 132 mg/dL (ref 0.0–149.0)
VLDL: 26.4 mg/dL (ref 0.0–40.0)

## 2017-04-17 LAB — PSA: PSA: 1.13 ng/mL (ref 0.10–4.00)

## 2017-04-17 LAB — TSH: TSH: 0.95 u[IU]/mL (ref 0.35–4.50)

## 2017-04-17 MED ORDER — DESOXIMETASONE 0.25 % EX CREA: 1.0000 "application " | TOPICAL_CREAM | Freq: Two times a day (BID) | CUTANEOUS | 0 refills | Status: DC

## 2017-04-17 NOTE — Patient Instructions (Signed)

## 2017-04-17 NOTE — Progress Notes (Signed)
Patient ID: Joel Gallegos, male    DOB: 09/11/1965  Age: 51 y.o. MRN: [id number]    Subjective:  Subjective  HPI Joel Gallegos presents for cpe.  No complaints.    Review of Systems  Constitutional: Negative.  Negative for chills and fever.  HENT: Negative for congestion, ear pain, hearing loss, nosebleeds, postnasal drip, rhinorrhea, sinus pressure, sneezing and tinnitus.   Eyes: Negative for photophobia, discharge, itching and visual disturbance.  Respiratory: Negative.  Negative for cough and shortness of breath.   Cardiovascular: Negative.  Negative for chest pain, palpitations and leg swelling.  Gastrointestinal: Negative for abdominal distention, abdominal pain, anal bleeding, blood in stool, constipation, diarrhea, nausea and vomiting.  Endocrine: Negative.   Genitourinary: Negative.  Negative for dysuria, frequency, hematuria and urgency.  Musculoskeletal: Negative.  Negative for back pain and myalgias.  Skin: Negative.  Negative for rash.  Allergic/Immunologic: Negative.  Negative for environmental allergies.  Neurological: Negative for dizziness, weakness, light-headedness, numbness and headaches.  Hematological: Does not bruise/bleed easily.  Psychiatric/Behavioral: Negative for agitation, confusion, decreased concentration, dysphoric mood, sleep disturbance and suicidal ideas. The patient is not nervous/anxious.     History No past medical history on file.  He has a past surgical history that includes Knee surgery.   His family history is not on file.He reports that he has never smoked. He has never used smokeless tobacco. He reports that he drinks alcohol. He reports that he does not use drugs.  No current outpatient prescriptions on file prior to visit.   No current facility-administered medications on file prior to visit.      Objective:  Objective  Physical Exam  Constitutional: He is oriented to person, place, and time. He appears well-developed and  well-nourished. No distress.  HENT:  Head: Normocephalic and atraumatic.  Right Ear: External ear normal.  Left Ear: External ear normal.  Nose: Nose normal.  Mouth/Throat: Oropharynx is clear and moist. No oropharyngeal exudate.  Eyes: Pupils are equal, round, and reactive to light. Conjunctivae and EOM are normal. Right eye exhibits no discharge. Left eye exhibits no discharge.  Neck: Normal range of motion. Neck supple. No JVD present. No thyromegaly present.  Cardiovascular: Normal rate, regular rhythm and intact distal pulses.  Exam reveals no gallop and no friction rub.   No murmur heard. Pulmonary/Chest: Effort normal and breath sounds normal. No respiratory distress. He has no wheezes. He has no rales. He exhibits no tenderness.  Abdominal: Soft. Bowel sounds are normal. He exhibits no distension and no mass. There is no tenderness. There is no rebound and no guarding.  Genitourinary: Rectum normal, prostate normal and penis normal. Rectal exam shows guaiac negative stool.  Musculoskeletal: Normal range of motion. He exhibits no edema or tenderness.  Lymphadenopathy:    He has no cervical adenopathy.  Neurological: He is alert and oriented to person, place, and time. He displays normal reflexes. He exhibits normal muscle tone.  Skin: Skin is warm and dry. No rash noted. He is not diaphoretic. There is erythema. No pallor.     Psychiatric: He has a normal mood and affect. His behavior is normal. Judgment and thought content normal.   BP 120/78   Pulse 60   Temp 98.1 F (36.7 C) (Oral)   Ht 6' (1.829 m)   Wt 184 lb (83.5 kg)   BMI 24.95 kg/m  Wt Readings from Last 3 Encounters:  04/17/17 184 lb (83.5 kg)  02/03/17 175 lb 12.8 oz (79.7 kg)  03/05/16 180 lb (81.6 kg)     Lab Results  Component Value Date   WBC 5.7 04/17/2017   HGB 16.1 04/17/2017   HCT 48.7 04/17/2017   PLT 172.0 04/17/2017   GLUCOSE 82 04/17/2017   CHOL 217 (H) 04/17/2017   TRIG 132.0 04/17/2017    HDL 61.80 04/17/2017   LDLDIRECT 123.9 06/16/2012   LDLCALC 129 (H) 04/17/2017   ALT 22 04/17/2017   AST 16 04/17/2017   NA 139 04/17/2017   K 4.3 04/17/2017   CL 103 04/17/2017   CREATININE 1.05 04/17/2017   BUN 18 04/17/2017   CO2 31 04/17/2017   TSH 0.95 04/17/2017   PSA 1.13 04/17/2017    Dg Knee 2 Views Left  Result Date: 01/27/2017 CLINICAL DATA:  Laceration. EXAM: LEFT KNEE - 1-2 VIEW COMPARISON:  None. FINDINGS: No evidence of fracture, dislocation, or joint effusion. No evidence of arthropathy or other focal bone abnormality. Soft tissue swelling anterior to the patella. No radiopaque foreign body seen. IMPRESSION: Soft tissue swelling anterior to the patella. No radiopaque foreign body seen. Electronically Signed   By: Fidela Salisbury M.D.   On: 01/27/2017 00:12     Assessment & Plan:  Plan  I have discontinued Joel Gallegos Desoximetasone. I am also having him start on desoximetasone.  Meds ordered this encounter  Medications  . desoximetasone (TOPICORT) 0.25 % cream    Sig: Apply 1 application topically 2 (two) times daily.    Dispense:  30 g    Refill:  0    Problem List Items Addressed This Visit      Unprioritized   Preventative health care - Primary    ghm utd  Check labs See AVS      Relevant Orders   Ambulatory referral to Gastroenterology   PSA (Completed)   TSH (Completed)   Lipid panel (Completed)   CBC with Differential/Platelet (Completed)   Comprehensive metabolic panel (Completed)    Other Visit Diagnoses    Need for immunization against influenza       Relevant Orders   Flu Vaccine QUAD 6+ mos IM (Fluarix) (Completed)    1. Need for immunization against influenza  - Flu Vaccine QUAD 6+ mos IM (Fluarix)  2. Preventative health care See above - Ambulatory referral to Gastroenterology - PSA - TSH - Lipid panel - CBC with Differential/Platelet - Comprehensive metabolic panel   Follow-up: Return in about 1 year (around  04/17/2018) for annual exam, fasting.  Ann Held, DO

## 2017-04-18 DIAGNOSIS — Z0000 Encounter for general adult medical examination without abnormal findings: Secondary | ICD-10-CM | POA: Insufficient documentation

## 2017-04-18 NOTE — Assessment & Plan Note (Signed)
ghm utd Check labs See AVS 

## 2017-05-07 ENCOUNTER — Encounter: Payer: Self-pay | Admitting: Gastroenterology

## 2017-05-09 ENCOUNTER — Encounter: Payer: Self-pay | Admitting: Family Medicine

## 2017-05-12 NOTE — Telephone Encounter (Signed)
Form completed and placed at front desk for pick up at his convenience. Copy of form sent for scanning.

## 2017-06-30 ENCOUNTER — Ambulatory Visit (AMBULATORY_SURGERY_CENTER): Payer: Self-pay | Admitting: *Deleted

## 2017-06-30 ENCOUNTER — Other Ambulatory Visit: Payer: Self-pay

## 2017-06-30 VITALS — Ht 72.0 in | Wt 189.0 lb

## 2017-06-30 DIAGNOSIS — Z1211 Encounter for screening for malignant neoplasm of colon: Secondary | ICD-10-CM

## 2017-06-30 MED ORDER — PEG 3350-KCL-NA BICARB-NACL 420 G PO SOLR: 4000.0000 mL | Freq: Once | ORAL | 0 refills | Status: AC

## 2017-06-30 NOTE — Progress Notes (Signed)
Denies allergies to eggs or soy products. Denies complications with sedation or anesthesia. Denies O2 use. Denies use of diet or weight loss medications.  Emmi instructions given for colonoscopy.  

## 2017-07-08 ENCOUNTER — Encounter: Payer: Self-pay | Admitting: Gastroenterology

## 2017-07-14 ENCOUNTER — Other Ambulatory Visit: Payer: Self-pay

## 2017-07-14 ENCOUNTER — Encounter: Payer: Self-pay | Admitting: Gastroenterology

## 2017-07-14 ENCOUNTER — Ambulatory Visit (AMBULATORY_SURGERY_CENTER): Payer: BLUE CROSS/BLUE SHIELD | Admitting: Gastroenterology

## 2017-07-14 VITALS — BP 112/74 | HR 53 | Temp 97.8°F | Resp 12 | Ht 72.0 in | Wt 189.0 lb

## 2017-07-14 DIAGNOSIS — Z1212 Encounter for screening for malignant neoplasm of rectum: Secondary | ICD-10-CM | POA: Diagnosis not present

## 2017-07-14 DIAGNOSIS — Z1211 Encounter for screening for malignant neoplasm of colon: Secondary | ICD-10-CM | POA: Diagnosis present

## 2017-07-14 MED ORDER — SODIUM CHLORIDE 0.9 % IV SOLN: 500.0000 mL | Freq: Once | INTRAVENOUS | Status: DC

## 2017-07-14 NOTE — Op Note (Signed)
[place] Patient Name: Joel Gallegos Procedure Date: 07/14/2017 10:31 AM MRN: [id number] Endoscopist: Milus Banister , MD Age: 52 Referring MD:  Date of Birth: 02/08/1966 Gender: Male Account #: [id number] Procedure:                Colonoscopy Indications:              Screening for colorectal malignant neoplasm Medicines:                Monitored Anesthesia Care Procedure:                Pre-Anesthesia Assessment:                           - Prior to the procedure, a History and Physical                            was performed, and patient medications and                            allergies were reviewed. The patient's tolerance of                            previous anesthesia was also reviewed. The risks                            and benefits of the procedure and the sedation                            options and risks were discussed with the patient.                            All questions were answered, and informed consent                            was obtained. Prior Anticoagulants: The patient has                            taken no previous anticoagulant or antiplatelet                            agents. ASA Grade Assessment: II - A patient with                            mild systemic disease. After reviewing the risks                            and benefits, the patient was deemed in                            satisfactory condition to undergo the procedure.                           After obtaining informed consent, the colonoscope  was passed under direct vision. Throughout the                            procedure, the patient's blood pressure, pulse, and                            oxygen saturations were monitored continuously. The                            Colonoscope was introduced through the anus and                            advanced to the the cecum, identified by                            appendiceal orifice and  ileocecal valve. The                            colonoscopy was performed without difficulty. The                            patient tolerated the procedure well. The quality                            of the bowel preparation was good. The ileocecal                            valve, appendiceal orifice, and rectum were                            photographed. Scope In: 10:45:10 AM Scope Out: 17:61:60 AM Scope Withdrawal Time: 0 hours 7 minutes 45 seconds  Total Procedure Duration: 0 hours 11 minutes 41 seconds  Findings:                 The entire examined colon appeared normal on direct                            and retroflexion views. Complications:            No immediate complications. Estimated blood loss:                            None. Estimated Blood Loss:     Estimated blood loss: none. Impression:               - The entire examined colon is normal on direct and                            retroflexion views.                           - No polyps or cancers Recommendation:           - Patient has a contact number available for  emergencies. The signs and symptoms of potential                            delayed complications were discussed with the                            patient. Return to normal activities tomorrow.                            Written discharge instructions were provided to the                            patient.                           - Resume previous diet.                           - Continue present medications.                           - Repeat colonoscopy in 10 years for screening. Milus Banister, MD 07/14/2017 10:59:23 AM This report has been signed electronically.

## 2017-07-14 NOTE — Progress Notes (Signed)
Report given to PACU, vss 

## 2017-07-14 NOTE — Progress Notes (Signed)
Pt's states no medical or surgical changes since previsit or office visit.No allergies to soy or eggs.

## 2017-07-14 NOTE — Patient Instructions (Signed)
Impression/recommendations:  Normal colonoscopy  YOU HAD AN ENDOSCOPIC PROCEDURE TODAY AT [facility]:   Refer to the procedure report that was given to you for any specific questions about what was found during the examination.  If the procedure report does not answer your questions, please call your gastroenterologist to clarify.  If you requested that your care partner not be given the details of your procedure findings, then the procedure report has been included in a sealed envelope for you to review at your convenience later.  YOU SHOULD EXPECT: Some feelings of bloating in the abdomen. Passage of more gas than usual.  Walking can help get rid of the air that was put into your GI tract during the procedure and reduce the bloating. If you had a lower endoscopy (such as a colonoscopy or flexible sigmoidoscopy) you may notice spotting of blood in your stool or on the toilet paper. If you underwent a bowel prep for your procedure, you may not have a normal bowel movement for a few days.  Please Note:  You might notice some irritation and congestion in your nose or some drainage.  This is from the oxygen used during your procedure.  There is no need for concern and it should clear up in a day or so.  SYMPTOMS TO REPORT IMMEDIATELY:   Following lower endoscopy (colonoscopy or flexible sigmoidoscopy):  Excessive amounts of blood in the stool  Significant tenderness or worsening of abdominal pains  Swelling of the abdomen that is new, acute  Fever of 100F or higher  For urgent or emergent issues, a gastroenterologist can be reached at any hour by calling [phone].   DIET:  We do recommend a small meal at first, but then you may proceed to your regular diet.  Drink plenty of fluids but you should avoid alcoholic beverages for 24 hours.  ACTIVITY:  You should plan to take it easy for the rest of today and you should NOT DRIVE or use heavy machinery until tomorrow (because  of the sedation medicines used during the test).    FOLLOW UP: Our staff will call the number listed on your records the next business day following your procedure to check on you and address any questions or concerns that you may have regarding the information given to you following your procedure. If we do not reach you, we will leave a message.  However, if you are feeling well and you are not experiencing any problems, there is no need to return our call.  We will assume that you have returned to your regular daily activities without incident.  If any biopsies were taken you will be contacted by phone or by letter within the next 1-3 weeks.  Please call us at [phone] if you have not heard about the biopsies in 3 weeks.    SIGNATURES/CONFIDENTIALITY: You and/or your care partner have signed paperwork which will be entered into your electronic medical record.  These signatures attest to the fact that that the information above on your After Visit Summary has been reviewed and is understood.  Full responsibility of the confidentiality of this discharge information lies with you and/or your care-partner.

## 2017-07-15 ENCOUNTER — Telehealth: Payer: Self-pay | Admitting: *Deleted

## 2017-07-15 NOTE — Telephone Encounter (Signed)
  Follow up Call-  Call back number 07/14/2017  Post procedure Call Back phone  # [phone]  Permission to leave phone message Yes  Some recent data might be hidden     Patient questions:  Do you have a fever, pain , or abdominal swelling? No. Pain Score  0 *  Have you tolerated food without any problems? Yes.    Have you been able to return to your normal activities? Yes.    Do you have any questions about your discharge instructions: Diet   No. Medications  No. Follow up visit  No.  Do you have questions or concerns about your Care? No.  Actions: * If pain score is 4 or above: No action needed, pain <4.

## 2018-05-07 IMAGING — DX DG KNEE 1-2V*L*
2 series · 2 of 2 positions shown · non-contrast
Comparison: None.

CLINICAL DATA: Laceration.

EXAM:
LEFT KNEE - 1-2 VIEW

[knee ap]
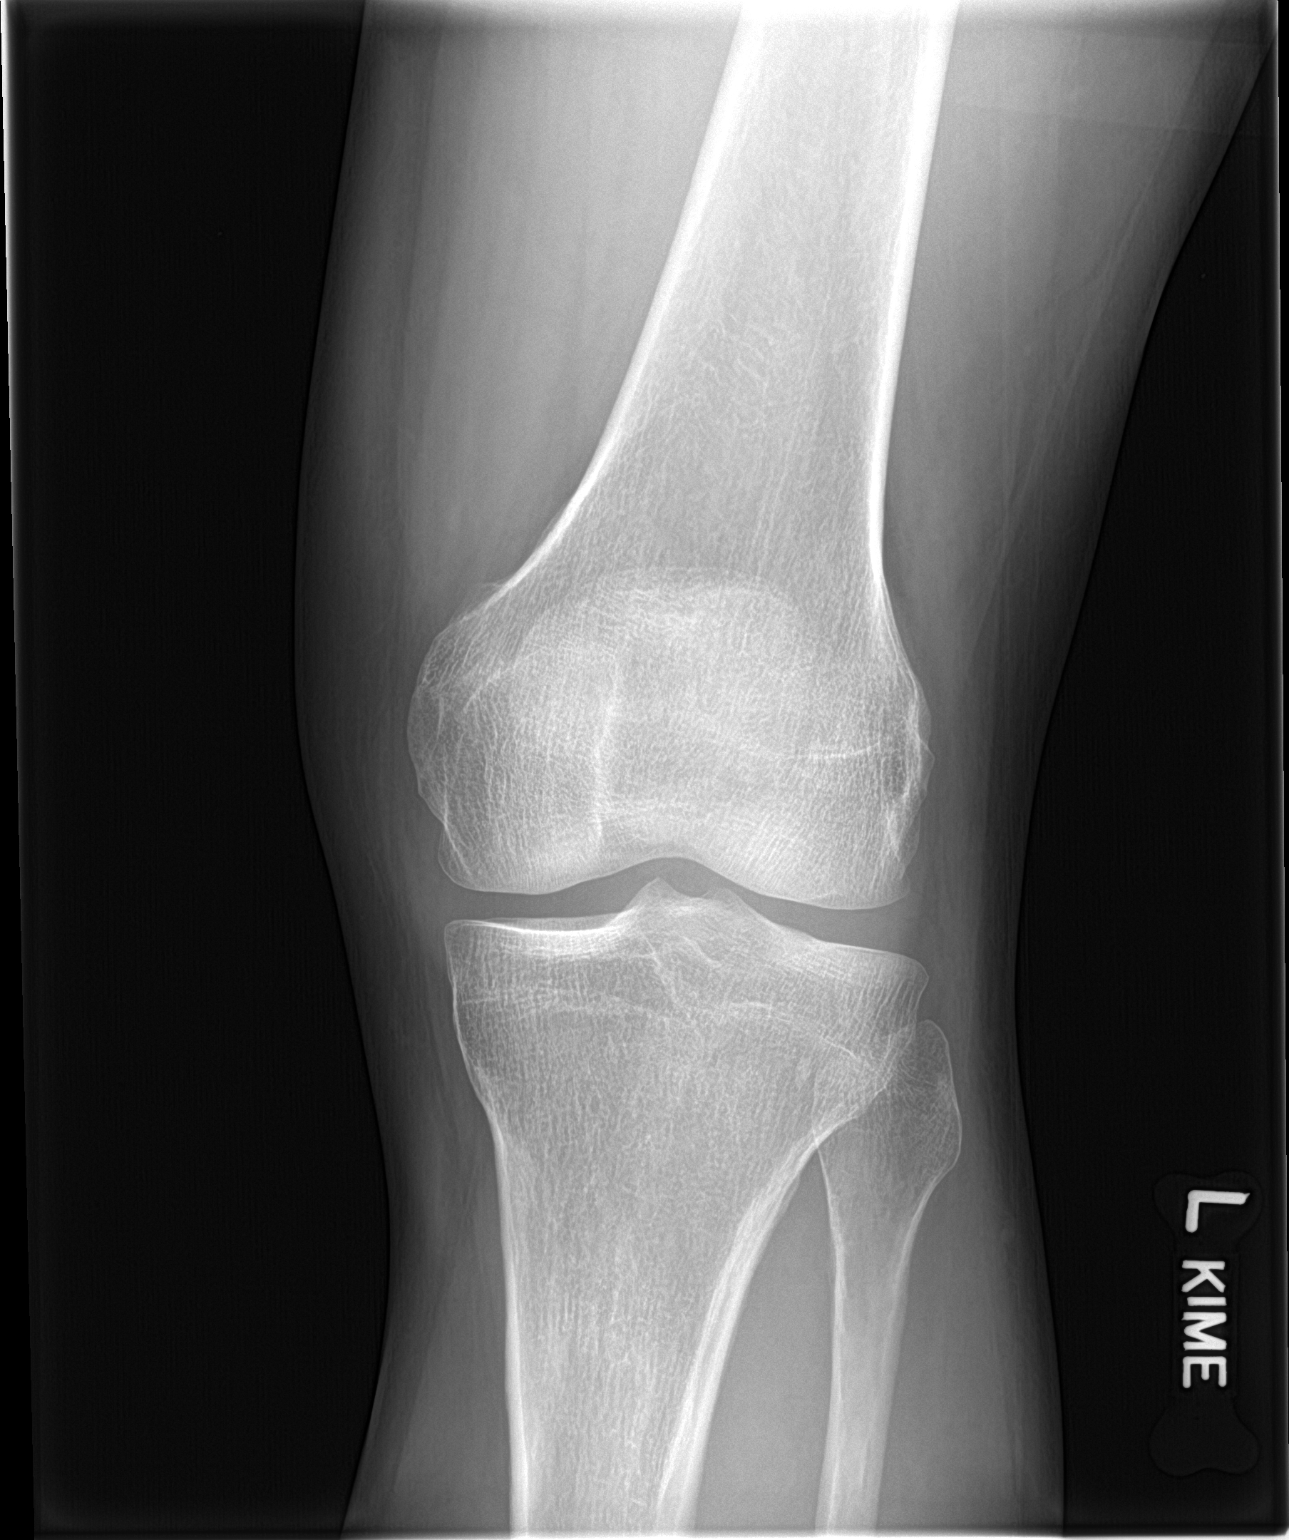

[knee lat]
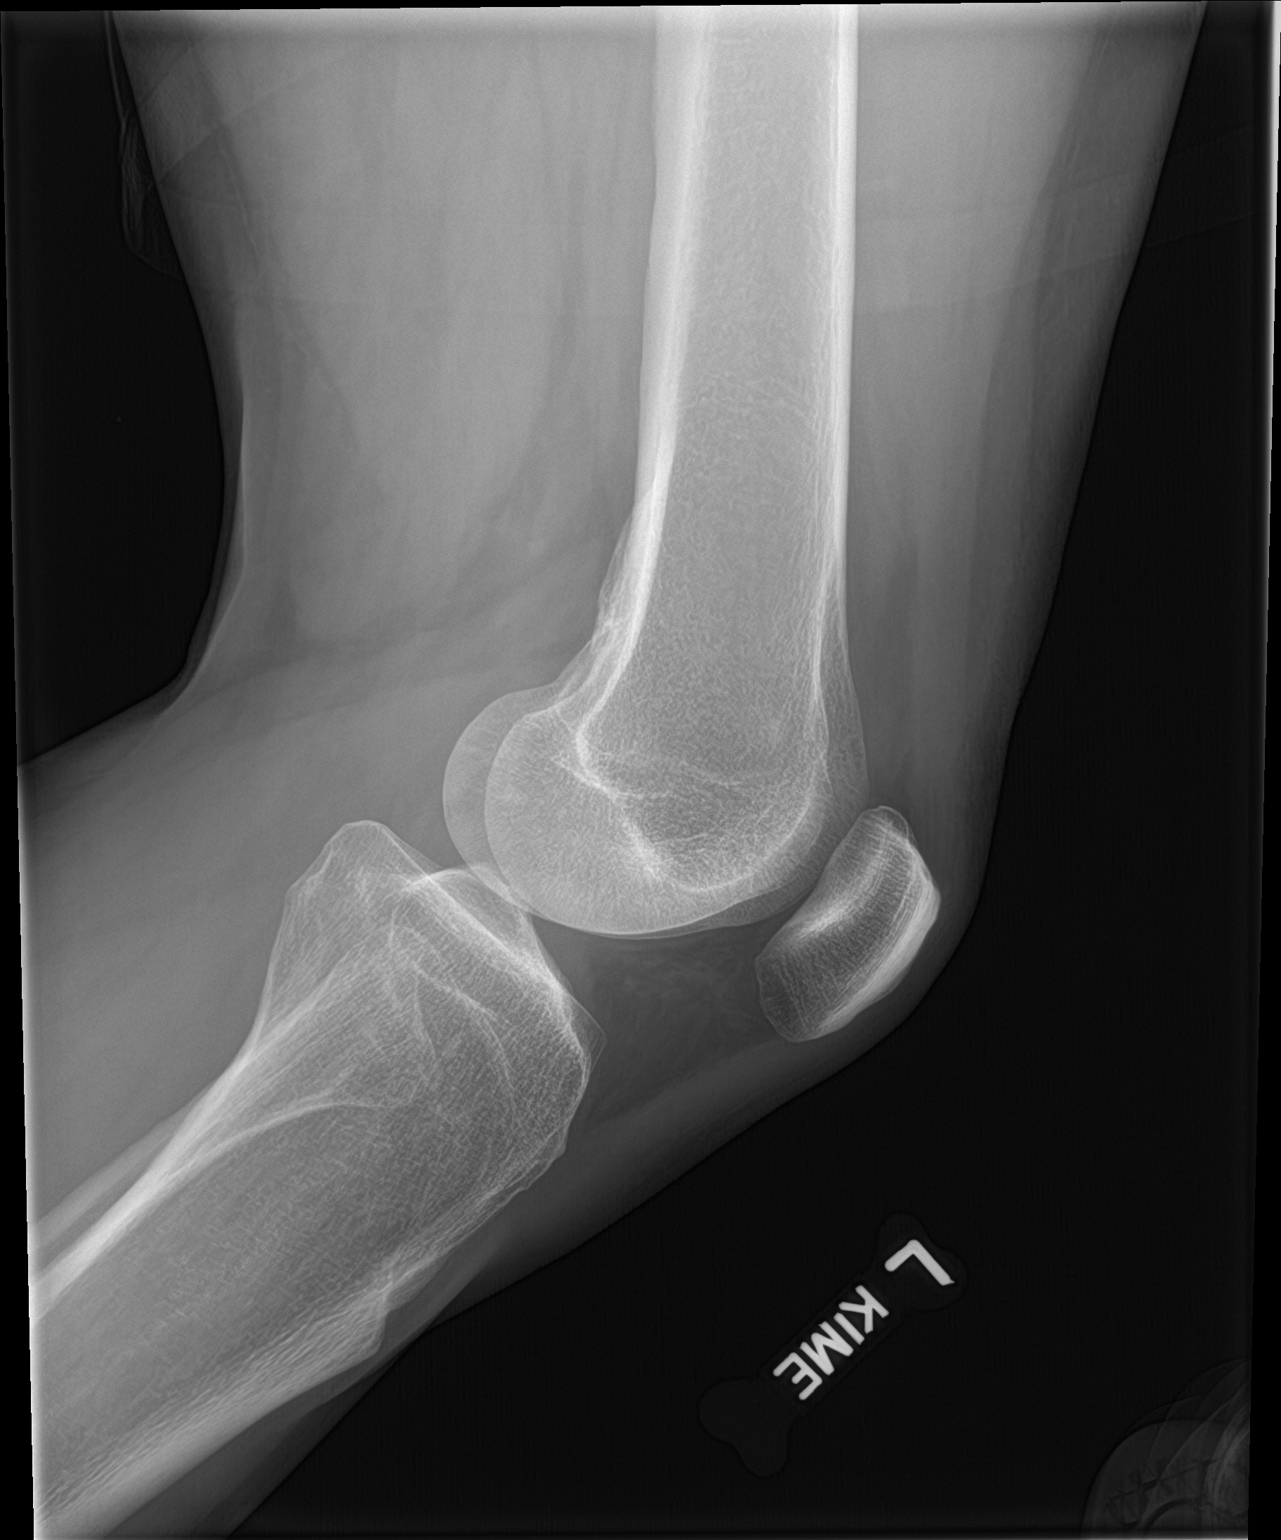

[2 of 2 positions shown; findings below may reference images not displayed]

FINDINGS: No evidence of fracture, dislocation, or joint effusion. No evidence
of arthropathy or other focal bone abnormality. Soft tissue swelling
anterior to the patella. No radiopaque foreign body seen.
IMPRESSION: Soft tissue swelling anterior to the patella.

No radiopaque foreign body seen.

## 2019-06-11 ENCOUNTER — Encounter: Payer: Self-pay | Admitting: Family

## 2019-06-11 ENCOUNTER — Other Ambulatory Visit: Payer: Self-pay

## 2019-06-11 ENCOUNTER — Ambulatory Visit (INDEPENDENT_AMBULATORY_CARE_PROVIDER_SITE_OTHER): Payer: BC Managed Care – PPO | Admitting: Family

## 2019-06-11 DIAGNOSIS — R0981 Nasal congestion: Secondary | ICD-10-CM

## 2019-06-11 MED ORDER — AMOXICILLIN-POT CLAVULANATE 875-125 MG PO TABS: 1.0000 | ORAL_TABLET | Freq: Two times a day (BID) | ORAL | 0 refills | Status: DC

## 2019-06-11 NOTE — Progress Notes (Signed)
Virtual Visit via Video Note  I connected with Joel Gallegos on 06/11/19 at  3:40 PM EST by a video enabled telemedicine application and verified that I am speaking with the correct person using two identifiers.  Location: Patient: work Provider: home   I discussed the limitations of evaluation and management by telemedicine and the availability of in person appointments. The patient expressed understanding and agreed to proceed.  History of Present Illness:     HPI  Patient is a 53 yr old male who presents today with chief complaint of nasal congestion. Reports that he has had dark yellow nasal darainage  Reports that he has been taking dayquil for a few days.  Congestion- started 5 days ago. Denies fever.  Reports mild  sore throat. Denies headache.  Denies myalgia, body aches, loss of taste or smell.  No known covid exposure at work.  Wife has some nasal congestion which just started.    Review of Systems See HPI  No past medical history on file.   Social History   Socioeconomic History  . Marital status: Married    Spouse name: Not on file  . Number of children: Not on file  . Years of education: Not on file  . Highest education level: Not on file  Occupational History  . Not on file  Tobacco Use  . Smoking status: Former Smoker    Quit date: 07/01/1988    Years since quitting: 30.9  . Smokeless tobacco: Never Used  Substance and Sexual Activity  . Alcohol use: Yes    Alcohol/week: 8.0 standard drinks    Types: 8 Cans of beer per week  . Drug use: No  . Sexual activity: Yes    Partners: Female  Other Topics Concern  . Not on file  Social History Narrative   Exercise--no   Social Determinants of Health   Financial Resource Strain:   . Difficulty of Paying Living Expenses: Not on file  Food Insecurity:   . Worried About Charity fundraiser in the Last Year: Not on file  . Ran Out of Food in the Last Year: Not on file  Transportation Needs:   . Lack of  Transportation (Medical): Not on file  . Lack of Transportation (Non-Medical): Not on file  Physical Activity:   . Days of Exercise per Week: Not on file  . Minutes of Exercise per Session: Not on file  Stress:   . Feeling of Stress : Not on file  Social Connections:   . Frequency of Communication with Friends and Family: Not on file  . Frequency of Social Gatherings with Friends and Family: Not on file  . Attends Religious Services: Not on file  . Active Member of Clubs or Organizations: Not on file  . Attends Archivist Meetings: Not on file  . Marital Status: Not on file  Intimate Partner Violence:   . Fear of Current or Ex-Partner: Not on file  . Emotionally Abused: Not on file  . Physically Abused: Not on file  . Sexually Abused: Not on file    Past Surgical History:  Procedure Laterality Date  . KNEE ARTHROSCOPY Bilateral 1982  . KNEE SURGERY      Family History  Problem Relation Age of Onset  . Colon cancer Neg Hx   . Esophageal cancer Neg Hx   . Rectal cancer Neg Hx   . Stomach cancer Neg Hx     No Known Allergies  Current Outpatient Medications  on File Prior to Visit  Medication Sig Dispense Refill  . desoximetasone (TOPICORT) 0.25 % cream Apply 1 application topically 2 (two) times daily. 30 g 0   No current facility-administered medications on file prior to visit.    There were no vitals taken for this visit.   Observations/Objective:   Gen: Awake, alert, no acute distress Resp: Breathing is even and non-labored Psych: calm/pleasant demeanor Neuro: Alert and Oriented x 3, + facial symmetry, speech is clear.   Assessment and Plan:  Nasal congestion- recommended covid-19 testing. He would like to be tested on Monday through the community testing site at [facility]. I gave him the information to sign up for an appointment. I advised him to begin empiric augmentin for possible sinusitis.  He should quarantine at home until we are able to  review his covid results and give him further recommendations.   Follow Up Instructions:    I discussed the assessment and treatment plan with the patient. The patient was provided an opportunity to ask questions and all were answered. The patient agreed with the plan and demonstrated an understanding of the instructions.   The patient was advised to call back or seek an in-person evaluation if the symptoms worsen or if the condition fails to improve as anticipated.  Nance Pear, NP

## 2019-06-13 ENCOUNTER — Encounter: Payer: Self-pay | Admitting: Family

## 2019-06-13 DIAGNOSIS — Z20828 Contact with and (suspected) exposure to other viral communicable diseases: Secondary | ICD-10-CM | POA: Diagnosis not present

## 2019-06-13 DIAGNOSIS — Z03818 Encounter for observation for suspected exposure to other biological agents ruled out: Secondary | ICD-10-CM | POA: Diagnosis not present

## 2019-06-14 NOTE — Telephone Encounter (Signed)
Great!  Is the pt feeling better ?

## 2020-05-18 ENCOUNTER — Telehealth (INDEPENDENT_AMBULATORY_CARE_PROVIDER_SITE_OTHER): Payer: BC Managed Care – PPO | Admitting: Family Medicine

## 2020-05-18 DIAGNOSIS — R0981 Nasal congestion: Secondary | ICD-10-CM | POA: Diagnosis not present

## 2020-05-18 NOTE — Progress Notes (Signed)
Virtual Visit via Telephone Note  I connected with Joel Gallegos on 05/18/20 at  6:20 PM EST by telephone and verified that I am speaking with the correct person using two identifiers.   I discussed the limitations, risks, security and privacy concerns of performing an evaluation and management service by telephone and the availability of in person appointments. I also discussed with the patient that there may be a patient responsible charge related to this service. The patient expressed understanding and agreed to proceed.  Location patient: home, [place] Location provider: work or home office Participants present for the call: patient, provider Patient did not have a visit with me in the prior 7 days to address this/these issue(s).   History of Present Illness:  Acute telemedicine visit for Sinus issues: -Onset: about 5 days ago -Symptoms include: sneezing, nasal congestion -Denies: fevers, SOB, CP, NVD, loss of taste or smell -one coworker at work with a cold tested  -Has tried: nyquil, sudafed, musinex -Pertinent past medical history: sinus infection in the past - this feels the same -Pertinent medication allergies: knda -COVID-19 vaccine status:fully vaccinated for covid19 and got the booster   Observations/Objective: Patient sounds cheerful and well on the phone. I do not appreciate any SOB. Speech and thought processing are grossly intact. Patient reported vitals:  Assessment and Plan:  Nasal congestion  -we discussed possible serious and likely etiologies, options for evaluation and workup, limitations of telemedicine visit vs in person visit, treatment, treatment risks and precautions. Pt prefers to treat via telemedicine empirically rather than in person at this moment.  Query viral upper respiratory illness versus allergies versus other.  He does not feel that he has any issues with allergies.  There is likely that he caught a cold at work.  Advised short course of nasal  decongestant for 3 days and nasal saline.  Discussed that Covid is less likely given fully vaccinated and had his booster, but not impossible.  His work is advised staying out until his symptoms are resolved and he needs a work note.  Discussed options for Covid testing. Work/School slipped offered: provided in patient instructions   Scheduled follow up with PCP offered: Agrees to follow-up if needed Advised to seek prompt follow-up with his PCP office or in person care if worsening, new symptoms arise, or if is not improving with treatment over the next few days.   Follow Up Instructions:  I did not refer this patient for an OV with me in the next 24 hours for this/these issue(s).  I discussed the assessment and treatment plan with the patient. The patient was provided an opportunity to ask questions and all were answered. The patient agreed with the plan and demonstrated an understanding of the instructions.   I spent 18 minutes on this encounter.   Lucretia Kern, DO

## 2020-05-18 NOTE — Patient Instructions (Signed)
   ---------------------------------------------------------------------------------------------------------------------------      WORK SLIP:  Patient Joel Gallegos,  05-Nov-1965, was seen for a medical visit today, 05/18/20 . Please excuse from work according to the [facility] guidelines for a COVID like illness. We advise 10 days minimum from the onset of symptoms (05/13/2020) PLUS 1 day of no fever and improved symptoms. Will defer to employer for a sooner return to work if [place] testing is negative and the symptoms have resolved. Advise following CDC guidelines.    Sincerely: E-signature: Dr. Colin Benton, DO [place] Ph: [phone]   ------------------------------------------------------------------------------------------------------------------------------    HOME CARE TIPS:  -[place] testing information: https://www.[facility].org/ OR 8121828044 Most pharmacies also offer testing and home test kits.  -can use tylenol or aleve if needed for fevers, aches and pains per instructions  -can use nasal saline a few times per day if nasal congestion,  -a short course of Afrin nasal spray for 3 days can help as well  -stay hydrated, drink plenty of fluids and eat small healthy meals - avoid dairy  -stay home while sick, except to seek medical care, and if you have COVID19 please stay home for a full 10 days since the onset of symptoms PLUS one day of no fever and feeling better.  It was nice to meet you today, and I really hope you are feeling better soon. I help [facility] out with telemedicine visits on Tuesdays and Thursdays and am available for visits on those days. If you have any concerns or questions following this visit please schedule a follow up visit with your Primary Care doctor or seek care at a local urgent care clinic to avoid delays in care.    Seek in person care promptly if your symptoms worsen, new  concerns arise or you are not improving with treatment over the next few days. Call 911 and/or seek emergency care if you symptoms are severe or life threatening.

## 2020-09-05 DIAGNOSIS — D229 Melanocytic nevi, unspecified: Secondary | ICD-10-CM | POA: Diagnosis not present

## 2020-09-05 DIAGNOSIS — L218 Other seborrheic dermatitis: Secondary | ICD-10-CM | POA: Diagnosis not present

## 2020-09-05 DIAGNOSIS — Z86018 Personal history of other benign neoplasm: Secondary | ICD-10-CM | POA: Diagnosis not present

## 2020-09-05 DIAGNOSIS — L578 Other skin changes due to chronic exposure to nonionizing radiation: Secondary | ICD-10-CM | POA: Diagnosis not present

## 2020-11-21 ENCOUNTER — Encounter: Payer: BC Managed Care – PPO | Admitting: Family Medicine

## 2020-12-04 ENCOUNTER — Ambulatory Visit (INDEPENDENT_AMBULATORY_CARE_PROVIDER_SITE_OTHER): Payer: BC Managed Care – PPO | Admitting: Family Medicine

## 2020-12-04 ENCOUNTER — Other Ambulatory Visit: Payer: Self-pay

## 2020-12-04 ENCOUNTER — Encounter: Payer: Self-pay | Admitting: Family Medicine

## 2020-12-04 VITALS — BP 118/78 | HR 70 | Temp 98.0°F | Resp 18 | Ht 72.0 in | Wt 194.0 lb

## 2020-12-04 DIAGNOSIS — Z0000 Encounter for general adult medical examination without abnormal findings: Secondary | ICD-10-CM | POA: Diagnosis not present

## 2020-12-04 DIAGNOSIS — Z1159 Encounter for screening for other viral diseases: Secondary | ICD-10-CM

## 2020-12-04 NOTE — Progress Notes (Signed)
Patient ID: Joel Gallegos, male    DOB: Nov 06, 1965  Age: 55 y.o. MRN: [id number]    Subjective:  Subjective  HPI [name] presents for a comprehensive physical examination today. He complains of itchiness in the chest region. He notes that it worsen at night time. He states that he has changed his detergent recently. He report that he got COVID around 10/30/2020. He denies any chest pain, SOB, fever, abdominal pain, cough, chills, sore throat, dysuria, urinary incontinence, back pain, HA, or N/V/D at this time. Pt agrees to COVID booster and shingles vaccination. He states that he see his dentist, optometrists, and dermatologist regularly.   Review of Systems  Constitutional: Negative.  Negative for chills, fatigue and fever.  HENT: Negative for congestion, ear pain, hearing loss, nosebleeds, postnasal drip, rhinorrhea, sinus pressure, sinus pain, sneezing, sore throat and tinnitus.   Eyes: Negative for photophobia, pain, discharge, itching and visual disturbance.  Respiratory: Negative.  Negative for cough, shortness of breath and wheezing.   Cardiovascular: Negative.  Negative for chest pain.  Gastrointestinal: Negative for abdominal distention, abdominal pain, anal bleeding, blood in stool, constipation, diarrhea, nausea and vomiting.  Endocrine: Negative.   Genitourinary: Negative.  Negative for flank pain.  Musculoskeletal: Negative.  Negative for back pain and neck pain.  Skin: Negative.  Negative for rash.       (+) itchiness in the chest region   Allergic/Immunologic: Negative.   Neurological: Negative for dizziness, seizures, weakness, light-headedness, numbness and headaches.  Psychiatric/Behavioral: Negative for agitation, confusion, decreased concentration, dysphoric mood, sleep disturbance and suicidal ideas. The patient is not nervous/anxious.     History No past medical history on file.  He has a past surgical history that includes Knee surgery and Knee arthroscopy  (Bilateral, 1982).   His family history is not on file.He reports that he quit smoking about 32 years ago. He has never used smokeless tobacco. He reports current alcohol use of about 8.0 standard drinks of alcohol per week. He reports that he does not use drugs.  Current Outpatient Medications on File Prior to Visit  Medication Sig Dispense Refill  . desoximetasone (TOPICORT) 0.25 % cream Apply 1 application topically 2 (two) times daily. 30 g 0   No current facility-administered medications on file prior to visit.     Objective:  Objective  Physical Exam Vitals and nursing note reviewed.  Constitutional:      General: He is not in acute distress.    Appearance: Normal appearance. He is well-developed. He is not ill-appearing.  HENT:     Head: Normocephalic and atraumatic.     Right Ear: External ear normal.     Left Ear: External ear normal.     Nose: Nose normal.  Eyes:     General:        Right eye: No discharge.        Left eye: No discharge.     Extraocular Movements: Extraocular movements intact.     Pupils: Pupils are equal, round, and reactive to light.  Cardiovascular:     Rate and Rhythm: Normal rate and regular rhythm.     Pulses: Normal pulses.     Heart sounds: Normal heart sounds. No murmur heard. No friction rub. No gallop.   Pulmonary:     Effort: Pulmonary effort is normal. No respiratory distress.     Breath sounds: Normal breath sounds. No stridor. No wheezing, rhonchi or rales.  Chest:     Chest wall: No  tenderness.  Abdominal:     General: Bowel sounds are normal. There is no distension.     Palpations: Abdomen is soft. There is no mass.     Tenderness: There is no abdominal tenderness. There is no guarding or rebound.     Hernia: No hernia is present.  Genitourinary:    Comments: Preferred not to have gu exam Musculoskeletal:        General: Normal range of motion.     Cervical back: Normal range of motion and neck supple.     Right lower leg:  No edema.     Left lower leg: No edema.  Skin:    General: Skin is warm and dry.  Neurological:     General: No focal deficit present.     Mental Status: He is alert and oriented to person, place, and time.  Psychiatric:        Mood and Affect: Mood normal.        Behavior: Behavior normal.        Thought Content: Thought content normal.        Judgment: Judgment normal.    BP 118/78 (BP Location: Right Arm, Patient Position: Sitting, Cuff Size: Normal)   Pulse 70   Temp 98 F (36.7 C) (Oral)   Resp 18   Ht 6' (1.829 m)   Wt 194 lb (88 kg)   SpO2 97%   BMI 26.31 kg/m  Wt Readings from Last 3 Encounters:  12/04/20 194 lb (88 kg)  07/14/17 189 lb (85.7 kg)  06/30/17 189 lb (85.7 kg)   Health Maintenance  Topic Date Due  . Hepatitis C Screening  Never done  . Zoster Vaccines- Shingrix (1 of 2) Never done  . HIV Screening  12/04/2021 (Originally 02/05/1982)  . INFLUENZA VACCINE  01/29/2021  . TETANUS/TDAP  06/21/2024  . COLONOSCOPY (Pts 45-69yrs Insurance coverage will need to be confirmed)  07/15/2027  . COVID-19 Vaccine  Completed  . Pneumococcal Vaccine 73-14 Years old  Aged Out  . HPV VACCINES  Aged Out     Lab Results  Component Value Date   WBC 5.7 04/17/2017   HGB 16.1 04/17/2017   HCT 48.7 04/17/2017   PLT 172.0 04/17/2017   GLUCOSE 82 04/17/2017   CHOL 217 (H) 04/17/2017   TRIG 132.0 04/17/2017   HDL 61.80 04/17/2017   LDLDIRECT 123.9 06/16/2012   LDLCALC 129 (H) 04/17/2017   ALT 22 04/17/2017   AST 16 04/17/2017   NA 139 04/17/2017   K 4.3 04/17/2017   CL 103 04/17/2017   CREATININE 1.05 04/17/2017   BUN 18 04/17/2017   CO2 31 04/17/2017   TSH 0.95 04/17/2017   PSA 1.13 04/17/2017    DG Knee 2 Views Left  Result Date: 01/27/2017 CLINICAL DATA:  Laceration. EXAM: LEFT KNEE - 1-2 VIEW COMPARISON:  None. FINDINGS: No evidence of fracture, dislocation, or joint effusion. No evidence of arthropathy or other focal bone abnormality. Soft tissue  swelling anterior to the patella. No radiopaque foreign body seen. IMPRESSION: Soft tissue swelling anterior to the patella. No radiopaque foreign body seen. Electronically Signed   By: Fidela Salisbury M.D.   On: 01/27/2017 00:12     Assessment & Plan:  Plan    No orders of the defined types were placed in this encounter.   Problem List Items Addressed This Visit      Unprioritized   Preventative health care - Primary   Relevant Orders   Lipid  panel   TSH   PSA   CBC with Differential/Platelet   Comprehensive metabolic panel    Other Visit Diagnoses    Need for hepatitis C screening test       Relevant Orders   Hepatitis C antibody     Colonoscopy: Last completed 07/14/2017, results are normal, repeat every 10 years.  Follow-up: Return in about 1 year (around 12/04/2021) for annual exam, fasting.   I,Gordon Zheng,acting as a Education administrator for [employer], DO.,have documented all relevant documentation on the behalf of Ann Held, DO,as directed by  Ann Held, DO while in the presence of [place], DO, have reviewed all documentation for this visit. The documentation on 12/04/20 for the exam, diagnosis, procedures, and orders are all accurate and complete.

## 2020-12-04 NOTE — Assessment & Plan Note (Signed)
ghm utd Check labs  See avs  F/u 1 year He will get covid booster and wait a few months for shingix

## 2020-12-04 NOTE — Patient Instructions (Signed)

## 2020-12-05 LAB — COMPREHENSIVE METABOLIC PANEL
ALT: 47 U/L (ref 0–53)
AST: 24 U/L (ref 0–37)
Albumin: 4.2 g/dL (ref 3.5–5.2)
Alkaline Phosphatase: 98 U/L (ref 39–117)
BUN: 19 mg/dL (ref 6–23)
CO2: 24 mEq/L (ref 19–32)
Calcium: 9.4 mg/dL (ref 8.4–10.5)
Chloride: 104 mEq/L (ref 96–112)
Creatinine, Ser: 1.07 mg/dL (ref 0.40–1.50)
GFR: 79.05 mL/min (ref >60.00)
Glucose, Bld: 86 mg/dL (ref 70–99)
Potassium: 4.5 mEq/L (ref 3.5–5.1)
Sodium: 139 mEq/L (ref 135–145)
Total Bilirubin: 1 mg/dL (ref 0.2–1.2)
Total Protein: 6.9 g/dL (ref 6.0–8.3)

## 2020-12-05 LAB — LIPID PANEL
Cholesterol: 221 mg/dL — ABNORMAL HIGH (ref 0–200)
HDL: 50 mg/dL (ref >39.00)
LDL Cholesterol: 143 mg/dL — ABNORMAL HIGH (ref 0–99)
NonHDL: 170.99
Total CHOL/HDL Ratio: 4
Triglycerides: 140 mg/dL (ref 0.0–149.0)
VLDL: 28 mg/dL (ref 0.0–40.0)

## 2020-12-05 LAB — CBC WITH DIFFERENTIAL/PLATELET
Basophils Absolute: 0.1 10*3/uL (ref 0.0–0.1)
Basophils Relative: 1.1 % (ref 0.0–3.0)
Eosinophils Absolute: 0.3 10*3/uL (ref 0.0–0.7)
Eosinophils Relative: 5.1 % — ABNORMAL HIGH (ref 0.0–5.0)
HCT: 48.4 % (ref 39.0–52.0)
Hemoglobin: 16.2 g/dL (ref 13.0–17.0)
Lymphocytes Relative: 16 % (ref 12.0–46.0)
Lymphs Abs: 1 10*3/uL (ref 0.7–4.0)
MCHC: 33.5 g/dL (ref 30.0–36.0)
MCV: 91.3 fl (ref 78.0–100.0)
Monocytes Absolute: 0.6 10*3/uL (ref 0.1–1.0)
Monocytes Relative: 9.4 % (ref 3.0–12.0)
Neutro Abs: 4.2 10*3/uL (ref 1.4–7.7)
Neutrophils Relative %: 68.4 % (ref 43.0–77.0)
Platelets: 190 10*3/uL (ref 150.0–400.0)
RBC: 5.3 Mil/uL (ref 4.22–5.81)
RDW: 13.2 % (ref 11.5–15.5)
WBC: 6.2 10*3/uL (ref 4.0–10.5)

## 2020-12-05 LAB — HEPATITIS C ANTIBODY
Hepatitis C Ab: NONREACTIVE
SIGNAL TO CUT-OFF: 0.01 (ref <1.00)

## 2020-12-05 LAB — PSA: PSA: 1 ng/mL (ref 0.10–4.00)

## 2020-12-05 LAB — TSH: TSH: 1.56 u[IU]/mL (ref 0.35–4.50)

## 2020-12-07 ENCOUNTER — Encounter: Payer: Self-pay | Admitting: Family Medicine

## 2020-12-08 NOTE — Telephone Encounter (Signed)
Form received and printed.

## 2020-12-11 ENCOUNTER — Other Ambulatory Visit: Payer: Self-pay | Admitting: Family Medicine

## 2020-12-11 DIAGNOSIS — E785 Hyperlipidemia, unspecified: Secondary | ICD-10-CM

## 2022-05-15 ENCOUNTER — Ambulatory Visit: Payer: BC Managed Care – PPO | Admitting: Medical

## 2022-06-20 ENCOUNTER — Encounter: Payer: Self-pay | Admitting: Family Medicine

## 2022-06-20 ENCOUNTER — Ambulatory Visit (INDEPENDENT_AMBULATORY_CARE_PROVIDER_SITE_OTHER): Payer: Commercial Managed Care - PPO | Admitting: Family Medicine

## 2022-06-20 VITALS — BP 100/80 | HR 75 | Temp 98.0°F | Resp 18 | Ht 72.0 in | Wt 203.8 lb

## 2022-06-20 DIAGNOSIS — Z0000 Encounter for general adult medical examination without abnormal findings: Secondary | ICD-10-CM

## 2022-06-20 DIAGNOSIS — E785 Hyperlipidemia, unspecified: Secondary | ICD-10-CM | POA: Diagnosis not present

## 2022-06-20 DIAGNOSIS — Z23 Encounter for immunization: Secondary | ICD-10-CM

## 2022-06-20 MED ORDER — DESOXIMETASONE 0.25 % EX CREA: 1.0000 | TOPICAL_CREAM | Freq: Two times a day (BID) | CUTANEOUS | 0 refills | Status: DC

## 2022-06-20 NOTE — Progress Notes (Signed)
Subjective:   By signing my name below, I, Joel Gallegos, attest that this documentation has been prepared under the direction and in the presence of Joel Gallegos, 06/20/2022.   Patient ID: Joel Gallegos, male    DOB: 1966-03-18, 56 y.o.   MRN: [id number]  Chief Complaint  Patient presents with   Annual Exam    Pt states not fasting     HPI Patient is in today for a comprehensive physical exam.  She denies new moles, itching, chills, fever, hearing loss, sinus pain, congestion, sore throat, cough and hemoptysis, chest pain, palpitations, wheezing, constipation, diarrhea, blood in stool, nausea and vomiting, dysuria, frequency, hematuria, myalgias and joint pain, depression, anxiety.  Skin Condition Patient is requesting a refill on 0.25 % Topicort cream.  Social history- There are no new surgical procedures to report. Colonoscopy - last completed on 07/14/2017. PSA - last completed on 12/04/2020. Immunizations- He is receiving an influenza vaccine this visit. He is uninterested in receiving Shingles vaccine today. Exercise-He does not participate in regular exercise. Vision - He is UTD on vision care. Dental - He is UTD on dental care.   Health Maintenance Due  Topic Date Due   INFLUENZA VACCINE  01/29/2022   COVID-19 Vaccine (4 - 2023-24 season) 03/01/2022    History reviewed. No pertinent past medical history.  Past Surgical History:  Procedure Laterality Date   KNEE ARTHROSCOPY Bilateral 1982   KNEE SURGERY      Family History  Problem Relation Age of Onset   Colon cancer Neg Hx    Esophageal cancer Neg Hx    Rectal cancer Neg Hx    Stomach cancer Neg Hx     Social History   Socioeconomic History   Marital status: Married    Spouse name: Not on file   Number of children: Not on file   Years of education: Not on file   Highest education level: Not on file  Occupational History   Not on file  Tobacco Use   Smoking status: Former    Types: Cigarettes     Quit date: 07/01/1988    Years since quitting: 33.9   Smokeless tobacco: Never  Vaping Use   Vaping Use: Never used  Substance and Sexual Activity   Alcohol use: Yes    Alcohol/week: 8.0 standard drinks of alcohol    Types: 8 Cans of beer per week   Drug use: No   Sexual activity: Yes    Partners: Female  Other Topics Concern   Not on file  Social History Narrative   Exercise--no   Social Determinants of Health   Financial Resource Strain: Not on file  Food Insecurity: Not on file  Transportation Needs: Not on file  Physical Activity: Not on file  Stress: Not on file  Social Connections: Not on file  Intimate Partner Violence: Not on file    Outpatient Medications Prior to Visit  Medication Sig Dispense Refill   desoximetasone (TOPICORT) 0.25 % cream Apply 1 application topically 2 (two) times daily. 30 g 0   No facility-administered medications prior to visit.    No Known Allergies  Review of Systems  Constitutional:  Negative for chills and fever.  HENT:  Negative for congestion, sinus pain and sore throat.   Respiratory:  Negative for cough, hemoptysis and wheezing.   Cardiovascular:  Negative for chest pain and palpitations.  Gastrointestinal:  Negative for blood in stool, constipation, diarrhea, nausea and vomiting.  Genitourinary:  Negative  for dysuria, frequency and hematuria.  Musculoskeletal:  Negative for joint pain and myalgias.  Skin:  Negative for itching.       (-) new moles  Psychiatric/Behavioral:  Negative for depression. The patient is not nervous/anxious.        Objective:    Physical Exam Vitals and nursing note reviewed.  Constitutional:      General: He is not in acute distress.    Appearance: Normal appearance. He is not ill-appearing.  HENT:     Head: Normocephalic and atraumatic.     Right Ear: Tympanic membrane, ear canal and external ear normal.     Left Ear: Tympanic membrane, ear canal and external ear normal.  Eyes:      Extraocular Movements: Extraocular movements intact.     Pupils: Pupils are equal, round, and reactive to light.  Cardiovascular:     Rate and Rhythm: Normal rate and regular rhythm.     Heart sounds: Normal heart sounds. No murmur heard.    No gallop.  Pulmonary:     Effort: Pulmonary effort is normal. No respiratory distress.     Breath sounds: Normal breath sounds. No wheezing or rales.  Abdominal:     General: Bowel sounds are normal. There is no distension.     Palpations: Abdomen is soft.     Tenderness: There is no abdominal tenderness. There is no guarding.  Skin:    General: Skin is warm and dry.  Neurological:     Mental Status: He is alert and oriented to person, place, and time.  Psychiatric:        Judgment: Judgment normal.     BP 100/80 (BP Location: Left Arm, Patient Position: Sitting, Cuff Size: Normal)   Pulse 75   Temp 98 F (36.7 C) (Oral)   Resp 18   Ht 6' (1.829 m)   Wt 203 lb 12.8 oz (92.4 kg)   SpO2 95%   BMI 27.64 kg/m  Wt Readings from Last 3 Encounters:  06/20/22 203 lb 12.8 oz (92.4 kg)  12/04/20 194 lb (88 kg)  07/14/17 189 lb (85.7 kg)       Assessment & Plan:   Problem List Items Addressed This Visit       Unprioritized   Preventative health care - Primary   Relevant Orders   Lipid panel   PSA   TSH   Comprehensive metabolic panel   CBC with Differential/Platelet   Other Visit Diagnoses     Hyperlipidemia, unspecified hyperlipidemia type       Relevant Orders   Lipid panel   PSA   TSH   Comprehensive metabolic panel   CBC with Differential/Platelet   Need for influenza vaccination       Relevant Orders   Flu Vaccine QUAD 6+ mos PF IM (Fluarix Quad PF)      Meds ordered this encounter  Medications   desoximetasone (TOPICORT) 0.25 % cream    Sig: Apply 1 Application topically 2 (two) times daily.    Dispense:  30 g    Refill:  0    I, Joel Gallegos, personally preformed the services described in this  documentation.  All medical record entries made by the scribe were at my direction and in my presence.  I have reviewed the chart and discharge instructions (if applicable) and agree that the record reflects my personal performance and is accurate and complete. 06/20/2022.   I,[name],acting as a Education administrator for [employer],  DO.,have documented all relevant documentation on the behalf of Ann Held, DO,as directed by  Ann Held, DO while in the presence of Ann Held, DO.    Ann Held, DO

## 2022-06-21 LAB — COMPREHENSIVE METABOLIC PANEL
ALT: 30 U/L (ref 0–53)
AST: 18 U/L (ref 0–37)
Albumin: 4.3 g/dL (ref 3.5–5.2)
Alkaline Phosphatase: 105 U/L (ref 39–117)
BUN: 21 mg/dL (ref 6–23)
CO2: 27 mEq/L (ref 19–32)
Calcium: 9.5 mg/dL (ref 8.4–10.5)
Chloride: 101 mEq/L (ref 96–112)
Creatinine, Ser: 1.14 mg/dL (ref 0.40–1.50)
GFR: 72.47 mL/min (ref >60.00)
Glucose, Bld: 83 mg/dL (ref 70–99)
Potassium: 4.4 mEq/L (ref 3.5–5.1)
Sodium: 140 mEq/L (ref 135–145)
Total Bilirubin: 0.6 mg/dL (ref 0.2–1.2)
Total Protein: 7.1 g/dL (ref 6.0–8.3)

## 2022-06-21 LAB — LIPID PANEL
Cholesterol: 199 mg/dL (ref 0–200)
HDL: 53.8 mg/dL (ref >39.00)
LDL Cholesterol: 107 mg/dL — ABNORMAL HIGH (ref 0–99)
NonHDL: 145.38
Total CHOL/HDL Ratio: 4
Triglycerides: 193 mg/dL — ABNORMAL HIGH (ref 0.0–149.0)
VLDL: 38.6 mg/dL (ref 0.0–40.0)

## 2022-06-21 LAB — CBC WITH DIFFERENTIAL/PLATELET
Basophils Absolute: 0 10*3/uL (ref 0.0–0.1)
Basophils Relative: 0.4 % (ref 0.0–3.0)
Eosinophils Absolute: 0.2 10*3/uL (ref 0.0–0.7)
Eosinophils Relative: 2 % (ref 0.0–5.0)
HCT: 50 % (ref 39.0–52.0)
Hemoglobin: 16.7 g/dL (ref 13.0–17.0)
Lymphocytes Relative: 12.7 % (ref 12.0–46.0)
Lymphs Abs: 1.1 10*3/uL (ref 0.7–4.0)
MCHC: 33.4 g/dL (ref 30.0–36.0)
MCV: 92.5 fl (ref 78.0–100.0)
Monocytes Absolute: 0.8 10*3/uL (ref 0.1–1.0)
Monocytes Relative: 9.6 % (ref 3.0–12.0)
Neutro Abs: 6.6 10*3/uL (ref 1.4–7.7)
Neutrophils Relative %: 75.3 % (ref 43.0–77.0)
Platelets: 232 10*3/uL (ref 150.0–400.0)
RBC: 5.41 Mil/uL (ref 4.22–5.81)
RDW: 12.9 % (ref 11.5–15.5)
WBC: 8.7 10*3/uL (ref 4.0–10.5)

## 2022-06-22 LAB — PSA: PSA: 1.13 ng/mL (ref 0.10–4.00)

## 2022-06-22 LAB — TSH: TSH: 1.08 u[IU]/mL (ref 0.35–5.50)

## 2022-10-02 ENCOUNTER — Other Ambulatory Visit (HOSPITAL_BASED_OUTPATIENT_CLINIC_OR_DEPARTMENT_OTHER): Payer: Self-pay

## 2022-10-02 ENCOUNTER — Encounter: Payer: Self-pay | Admitting: Family Medicine

## 2022-10-02 ENCOUNTER — Ambulatory Visit (INDEPENDENT_AMBULATORY_CARE_PROVIDER_SITE_OTHER): Payer: Commercial Managed Care - PPO | Admitting: Family Medicine

## 2022-10-02 VITALS — BP 123/76 | HR 62 | Temp 98.0°F | Ht 72.0 in | Wt 203.0 lb

## 2022-10-02 DIAGNOSIS — J40 Bronchitis, not specified as acute or chronic: Secondary | ICD-10-CM

## 2022-10-02 MED ORDER — AMOXICILLIN-POT CLAVULANATE 875-125 MG PO TABS
1.0000 | ORAL_TABLET | Freq: Two times a day (BID) | ORAL | 0 refills | Status: DC
Start: 2022-10-02 — End: 2023-02-04
  Filled 2022-10-02: qty 20, 10d supply, fill #0

## 2022-10-02 MED ORDER — BENZONATATE 100 MG PO CAPS
100.0000 mg | ORAL_CAPSULE | Freq: Two times a day (BID) | ORAL | 0 refills | Status: DC | PRN
Start: 2022-10-02 — End: 2023-02-04
  Filled 2022-10-02: qty 20, 10d supply, fill #0

## 2022-10-02 NOTE — Patient Instructions (Signed)
Augmentin added given duration of symptoms.  Adding Tessalon for cough.  Continue supportive measures including rest, hydration, humidifier use, steam showers, warm compresses to sinuses, warm liquids with lemon and honey, and over-the-counter cough, cold, and analgesics as needed.   Please contact office for follow-up if symptoms do not improve or worsen. Seek emergency care if symptoms become severe.   The following information is provided as a Human resources officer for ADULT patients only and does NOT take into account PREGNANCY, ALLERGIES, LIVER CONDITIONS, KIDNEY CONDITIONS, GASTROINTESTINAL CONDITIONS, OR PRESCRIPTION MEDICATION INTERACTIONS. Please be sure to ask your provider if the following are safe to take with your specific medical history, conditions, or current medication regimen if you are unsure.   Adult Basic Symptom Management for Sinusitis  Congestion: Guaifenesin (Mucinex)- follow directions on packaging with a maximum dose of 2400mg  in a 24 hour period.  Pain/Fever: Ibuprofen 200mg  - 400mg  every 4-6 hours as needed (MAX 1200mg  in a 24 hour period) Pain/Fever: Tylenol 500mg  -1000mg  every 6-8 hours as needed (MAX 3000mg  in a 24 hour period)  Cough: Dextromethorphan (Delsym)- follow directions on packing with a maximum dose of 120mg  in a 24 hour period.  Nasal Stuffiness: Saline nasal spray and/or Nettie Pot with sterile saline solution  Runny Nose: Fluticasone nasal spray (Flonase) OR Mometasone nasal spray (Nasonex) OR Triamcinolone Acetonide nasal spray (Nasacort)- follow directions on the packaging  Pain/Pressure: Warm washcloth to the face  Sore Throat: Warm salt water gargles  If you have allergies, you may also consider taking an oral antihistamine (like Zyrtec or Claritin) as these may also help with your symptoms.  **Many medications will have more than one ingredient, be sure you are reading the packaging carefully and not taking more than one dose of the same kind  of medication at the same time or too close together. It is OK to use formulas that have all of the ingredients you want, but do not take them in a combined medication and as separate dose too close together. If you have any questions, the pharmacist will be happy to help you decide what is safe.

## 2022-10-02 NOTE — Progress Notes (Signed)
Acute Office Visit  Subjective:     Patient ID: Joel Gallegos, male    DOB: 28-Jul-1965, 57 y.o.   MRN: [id number]  Chief Complaint  Patient presents with   Cough     Patient is in today for cough and URI.   He reports having a bad cold about a month ago that left him with an ongoing cough. States URI symtpoms have come and gone, but most recently, about a week ago he started with rhinorrhea, sore throat, postnasal drainage, and cough turned to productive with yellow sputum. He has not had any fevers, chills, chest pain, shortness of breath, wheezing, rashes, nausea, vomiting, diarrhea. Reports a negative home COVID test yesterday. He has gotten intermittent relief with Mucinex and Sudafed and warm liquids.    All review of systems negative except what is listed in the HPI      Objective:    BP 123/76   Pulse 62   Temp 98 F (36.7 C) (Oral)   Ht 6' (1.829 m)   Wt 203 lb (92.1 kg)   SpO2 98%   BMI 27.53 kg/m    Physical Exam Vitals reviewed.  Constitutional:      Appearance: Normal appearance.  HENT:     Right Ear: Tympanic membrane normal.     Left Ear: Tympanic membrane normal.     Nose: Rhinorrhea present.     Mouth/Throat:     Mouth: Mucous membranes are moist.     Pharynx: Oropharynx is clear.     Comments: Cobblestoning/PND Eyes:     Conjunctiva/sclera: Conjunctivae normal.  Cardiovascular:     Rate and Rhythm: Normal rate and regular rhythm.     Pulses: Normal pulses.     Heart sounds: Normal heart sounds.  Pulmonary:     Effort: Pulmonary effort is normal.     Breath sounds: Normal breath sounds. No wheezing, rhonchi or rales.  Musculoskeletal:     Cervical back: Normal range of motion and neck supple. No tenderness.  Lymphadenopathy:     Cervical: No cervical adenopathy.  Skin:    General: Skin is warm and dry.  Neurological:     Mental Status: He is alert and oriented to person, place, and time.  Psychiatric:        Mood and Affect: Mood  normal.        Behavior: Behavior normal.        Thought Content: Thought content normal.        Judgment: Judgment normal.     No results found for any visits on 10/02/22.      Assessment & Plan:   Problem List Items Addressed This Visit   None Visit Diagnoses     Bronchitis    -  Primary Augmentin added given duration of symptoms.  Adding Tessalon for cough.  Continue supportive measures including rest, hydration, humidifier use, steam showers, warm compresses to sinuses, warm liquids with lemon and honey, and over-the-counter cough, cold, and analgesics as needed.  Patient aware of signs/symptoms requiring further/urgent evaluation.    Relevant Medications   amoxicillin-clavulanate (AUGMENTIN) 875-125 MG tablet   benzonatate (TESSALON) 100 MG capsule       Meds ordered this encounter  Medications   amoxicillin-clavulanate (AUGMENTIN) 875-125 MG tablet    Sig: Take 1 tablet by mouth 2 (two) times daily.    Dispense:  20 tablet    Refill:  0    Order Specific Question:   Supervising Provider  Answer:   Penni Homans A [[id number]]   benzonatate (TESSALON) 100 MG capsule    Sig: Take 1 capsule (100 mg total) by mouth 2 (two) times daily as needed for cough.    Dispense:  20 capsule    Refill:  0    Order Specific Question:   Supervising Provider    Answer:   Penni Homans A [id number]    Return if symptoms worsen or fail to improve.  Terrilyn Saver, NP

## 2022-10-08 ENCOUNTER — Encounter: Payer: Self-pay | Admitting: Family Medicine

## 2023-02-04 ENCOUNTER — Ambulatory Visit (INDEPENDENT_AMBULATORY_CARE_PROVIDER_SITE_OTHER): Payer: Commercial Managed Care - PPO | Admitting: Family Medicine

## 2023-02-04 ENCOUNTER — Encounter: Payer: Self-pay | Admitting: Family Medicine

## 2023-02-04 VITALS — BP 124/74 | HR 64 | Resp 18 | Ht 72.0 in | Wt 206.2 lb

## 2023-02-04 DIAGNOSIS — M79671 Pain in right foot: Secondary | ICD-10-CM

## 2023-02-04 NOTE — Progress Notes (Signed)
`  Established Patient Office Visit  Subjective   Patient ID: VAIBHAV MORGANELLI, male    DOB: [birth year]  Age: 57 y.o. MRN: [id number]  Chief Complaint  Patient presents with   Ankle Pain    Right     HPI Discussed the use of AI scribe software for clinical note transcription with the patient, who gave verbal consent to proceed.  History of Present Illness   The patient presents with right foot pain that began a few weeks ago. He is unsure of the cause, but speculate that he may have stepped in a hole while mowing. The pain is located in the heel area and is worse when standing. The discomfort is most severe upon waking and initially getting out of bed, but used to improve throughout the day. Recently, the pain persists throughout the day. He have tried Aleve with minimal relief. The patient works twelve-hour shifts and is required to wear composite or steel toe shoes for work.      Patient Active Problem List   Diagnosis Date Noted   Pain of right heel 02/04/2023   Preventative health care 04/18/2017   Strain of left wrist 01/29/2016   Closed displaced fracture of head of right radius with routine healing 01/29/2016   Erectile dysfunction 08/24/2012   DEPRESSIVE DISORDER NOT ELSEWHERE CLASSIFIED 03/02/2010   NEVI, MULTIPLE 02/27/2009   HEADACHE 02/27/2009   Headache 02/27/2009   EJACULATION, ABNORMAL 02/26/2008   No past medical history on file. Past Surgical History:  Procedure Laterality Date   KNEE ARTHROSCOPY Bilateral 1982   KNEE SURGERY     Social History   Tobacco Use   Smoking status: Former    Current packs/day: 0.00    Types: Cigarettes    Quit date: 07/01/1988    Years since quitting: 34.6   Smokeless tobacco: Never  Vaping Use   Vaping status: Never Used  Substance Use Topics   Alcohol use: Yes    Alcohol/week: 8.0 standard drinks of alcohol    Types: 8 Cans of beer per week   Drug use: No   Social History   Socioeconomic History   Marital status: Married     Spouse name: Not on file   Number of children: Not on file   Years of education: Not on file   Highest education level: Associate degree: occupational, Scientist, product/process development, or vocational program  Occupational History   Not on file  Tobacco Use   Smoking status: Former    Current packs/day: 0.00    Types: Cigarettes    Quit date: 07/01/1988    Years since quitting: 34.6   Smokeless tobacco: Never  Vaping Use   Vaping status: Never Used  Substance and Sexual Activity   Alcohol use: Yes    Alcohol/week: 8.0 standard drinks of alcohol    Types: 8 Cans of beer per week   Drug use: No   Sexual activity: Yes    Partners: Female  Other Topics Concern   Not on file  Social History Narrative   Exercise--no   Social Determinants of Health   Financial Resource Strain: Low Risk  (10/01/2022)   Overall Financial Resource Strain (CARDIA)    Difficulty of Paying Living Expenses: Not hard at all  Food Insecurity: No Food Insecurity (10/01/2022)   Hunger Vital Sign    Worried About Running Out of Food in the Last Year: Never true    Ran Out of Food in the Last Year: Never true  Transportation Needs:  No Transportation Needs (10/01/2022)   PRAPARE - Administrator, Civil Service (Medical): No    Lack of Transportation (Non-Medical): No  Physical Activity: Insufficiently Active (10/01/2022)   Exercise Vital Sign    Days of Exercise per Week: 3 days    Minutes of Exercise per Session: 30 min  Stress: No Stress Concern Present (10/01/2022)   [employer] of Occupational Health - Occupational Stress Questionnaire    Feeling of Stress : Only a little  Social Connections: Socially Integrated (10/01/2022)   Social Connection and Isolation Panel [NHANES]    Frequency of Communication with Friends and Family: More than three times a week    Frequency of Social Gatherings with Friends and Family: More than three times a week    Attends Religious Services: 1 to 4 times per year    Active Member of  [employer] or Organizations: Yes    Attends Banker Meetings: 1 to 4 times per year    Marital Status: Married  Catering manager Violence: Not on file   Family Status  Relation Name Status   Mother  Alive   Father  Alive   Neg Hx  (Not Specified)  No partnership data on file   Family History  Problem Relation Age of Onset   Colon cancer Neg Hx    Esophageal cancer Neg Hx    Rectal cancer Neg Hx    Stomach cancer Neg Hx    No Known Allergies    Review of Systems  Constitutional:  Negative for fever and malaise/fatigue.  HENT:  Negative for congestion.   Eyes:  Negative for blurred vision.  Respiratory:  Negative for shortness of breath.   Cardiovascular:  Negative for chest pain, palpitations and leg swelling.  Gastrointestinal:  Negative for abdominal pain, blood in stool and nausea.  Genitourinary:  Negative for dysuria and frequency.  Musculoskeletal:  Positive for joint pain. Negative for falls.  Skin:  Negative for rash.  Neurological:  Negative for dizziness, loss of consciousness and headaches.  Endo/Heme/Allergies:  Negative for environmental allergies.  Psychiatric/Behavioral:  Negative for depression. The patient is not nervous/anxious.       Objective:     BP 124/74   Pulse 64   Resp 18   Ht 6' (1.829 m)   Wt 206 lb 3.2 oz (93.5 kg)   SpO2 98%   BMI 27.97 kg/m  BP Readings from Last 3 Encounters:  02/04/23 124/74  10/02/22 123/76  06/20/22 100/80   Wt Readings from Last 3 Encounters:  02/04/23 206 lb 3.2 oz (93.5 kg)  10/02/22 203 lb (92.1 kg)  06/20/22 203 lb 12.8 oz (92.4 kg)   SpO2 Readings from Last 3 Encounters:  02/04/23 98%  10/02/22 98%  06/20/22 95%      Physical Exam Vitals and nursing note reviewed.  Constitutional:      General: He is not in acute distress.    Appearance: Normal appearance. He is well-developed.  HENT:     Head: Normocephalic and atraumatic.  Eyes:     General: No scleral icterus.       Right  eye: No discharge.        Left eye: No discharge.  Cardiovascular:     Rate and Rhythm: Normal rate and regular rhythm.     Heart sounds: No murmur heard. Pulmonary:     Effort: Pulmonary effort is normal. No respiratory distress.     Breath sounds: Normal breath sounds.  Musculoskeletal:  General: Swelling and tenderness present. Normal range of motion.     Cervical back: Normal range of motion and neck supple.     Right lower leg: No edema.     Left lower leg: No edema.     Right foot: Tenderness present.     Comments: Point tenderness bottom R heel , minimal tenderness   Skin:    General: Skin is warm and dry.  Neurological:     Mental Status: He is alert and oriented to person, place, and time.  Psychiatric:        Mood and Affect: Mood normal.        Behavior: Behavior normal.        Thought Content: Thought content normal.        Judgment: Judgment normal.     No results found for any visits on 02/04/23.  Last CBC Lab Results  Component Value Date   WBC 8.7 06/20/2022   HGB 16.7 06/20/2022   HCT 50.0 06/20/2022   MCV 92.5 06/20/2022   RDW 12.9 06/20/2022   PLT 232.0 06/20/2022   Last metabolic panel Lab Results  Component Value Date   GLUCOSE 83 06/20/2022   NA 140 06/20/2022   K 4.4 06/20/2022   CL 101 06/20/2022   CO2 27 06/20/2022   BUN 21 06/20/2022   CREATININE 1.14 06/20/2022   GFR 72.47 06/20/2022   CALCIUM 9.5 06/20/2022   PROT 7.1 06/20/2022   ALBUMIN 4.3 06/20/2022   BILITOT 0.6 06/20/2022   ALKPHOS 105 06/20/2022   AST 18 06/20/2022   ALT 30 06/20/2022   Last lipids Lab Results  Component Value Date   CHOL 199 06/20/2022   HDL 53.80 06/20/2022   LDLCALC 107 (H) 06/20/2022   LDLDIRECT 123.9 06/16/2012   TRIG 193.0 (H) 06/20/2022   CHOLHDL 4 06/20/2022   Last hemoglobin A1c No results found for: "HGBA1C" Last thyroid functions Lab Results  Component Value Date   TSH 1.08 06/20/2022   Last vitamin D No results found for:  "25OHVITD2", "25OHVITD3", "VD25OH" Last vitamin B12 and Folate No results found for: "VITAMINB12", "FOLATE"    The 10-year ASCVD risk score (Arnett DK, et al., 2019) is: 4.9%    Assessment & Plan:   Problem List Items Addressed This Visit       Unprioritized   Pain of right heel - Primary    Stretch foot  Ice heel  Aleve as needed  Consider inserts ---  good feet store or shoe market Podiatry if no better      Assessment and Plan    Plantar Fasciitis Right foot pain, worse in the morning and with initial standing. Pain localized to the heel. Pain not significantly relieved with Aleve. Patient works long shifts and wears composite toe shoes. -Advise daily stretching exercises before getting out of bed. -Recommend icing the heel with a frozen water bottle daily. -Advise patient to visit a shoe store for appropriate footwear or inserts to accommodate for plantar fasciitis. -If symptoms persist, consider referral to a podiatrist.        Return if symptoms worsen or fail to improve.    Donato Schultz, DO

## 2023-02-04 NOTE — Assessment & Plan Note (Signed)
Stretch foot  Ice heel  Aleve as needed  Consider inserts ---  good feet store or shoe market Podiatry if no better

## 2023-02-04 NOTE — Patient Instructions (Signed)
Plantar Fasciitis  Plantar fasciitis is a painful foot condition that affects the heel. It occurs when the band of tissue that connects the toes to the heel bone (plantar fascia) becomes irritated. This can happen as the result of exercising too much or doing other repetitive activities (overuse injury). Plantar fasciitis can cause mild irritation to severe pain that makes it difficult to walk or move. The pain is usually worse in the morning after sleeping, or after sitting or lying down for a period of time. Pain may also be worse after long periods of walking or standing. What are the causes? This condition may be caused by: Standing for long periods of time. Wearing shoes that do not have good arch support. Doing activities that put stress on joints (high-impact activities). This includes ballet and exercise that makes your heart beat faster (aerobic exercise), such as running. Being overweight. An abnormal way of walking (gait). Tight muscles in the back of your lower leg (calf). High arches in your feet or flat feet. Starting a new athletic activity. What are the signs or symptoms? The main symptom of this condition is heel pain. Pain may get worse after the following: Taking the first steps after a time of rest, especially in the morning after awakening, or after you have been sitting or lying down for a while. Long periods of standing still. Pain may decrease after 30-45 minutes of activity, such as gentle walking. How is this diagnosed? This condition may be diagnosed based on your medical history, a physical exam, and your symptoms. Your health care provider will check for: A tender area on the bottom of your foot. A high arch in your foot or flat feet. Pain when you move your foot. Difficulty moving your foot. You may have imaging tests to confirm the diagnosis, such as: X-rays. Ultrasound. MRI. How is this treated? Treatment for plantar fasciitis depends on how severe your  condition is. Treatment may include: Rest, ice, pressure (compression), and raising (elevating) the affected foot. This is called RICE therapy. Your health care provider may recommend RICE therapy along with over-the-counter pain medicines to manage your pain. Exercises to stretch your calves and your plantar fascia. A splint that holds your foot in a stretched, upward position while you sleep (night splint). Physical therapy to relieve symptoms and prevent problems in the future. Injections of steroid medicine (cortisone) to relieve pain and inflammation. Stimulating your plantar fascia with electrical impulses (extracorporeal shock wave therapy). This is usually the last treatment option before surgery. Surgery, if other treatments have not worked after 12 months. Follow these instructions at home: Managing pain, stiffness, and swelling  If directed, put ice on the painful area. To do this: Put ice in a plastic bag, or use a frozen bottle of water. Place a towel between your skin and the bag or bottle. Roll the bottom of your foot over the bag or bottle. Do this for 20 minutes, 2-3 times a day. Wear athletic shoes that have air-sole or gel-sole cushions, or try soft shoe inserts that are designed for plantar fasciitis. Elevate your foot above the level of your heart while you are sitting or lying down. Activity Avoid activities that cause pain. Ask your health care provider what activities are safe for you. Do physical therapy exercises and stretches as told by your health care provider. Try activities and forms of exercise that are easier on your joints (low impact). Examples include swimming, water aerobics, and biking. General instructions Take over-the-counter   and prescription medicines only as told by your health care provider. Wear a night splint while sleeping, if told by your health care provider. Loosen the splint if your toes tingle, become numb, or turn cold and blue. Maintain a  healthy weight, or work with your health care provider to lose weight as needed. Keep all follow-up visits. This is important. Contact a health care provider if you have: Symptoms that do not go away with home treatment. Pain that gets worse. Pain that affects your ability to move or do daily activities. Summary Plantar fasciitis is a painful foot condition that affects the heel. It occurs when the band of tissue that connects the toes to the heel bone (plantar fascia) becomes irritated. Heel pain is the main symptom of this condition. It may get worse after exercising too much or standing still for a long time. Treatment varies, but it usually starts with rest, ice, pressure (compression), and raising (elevating) the affected foot. This is called RICE therapy. Over-the-counter medicines can also be used to manage pain. This information is not intended to replace advice given to you by your health care provider. Make sure you discuss any questions you have with your health care provider. Document Revised: 10/04/2019 Document Reviewed: 10/04/2019 Elsevier Patient Education  2024 Elsevier Inc.  

## 2023-04-01 ENCOUNTER — Other Ambulatory Visit (HOSPITAL_BASED_OUTPATIENT_CLINIC_OR_DEPARTMENT_OTHER): Payer: Self-pay

## 2023-04-01 ENCOUNTER — Ambulatory Visit (INDEPENDENT_AMBULATORY_CARE_PROVIDER_SITE_OTHER): Payer: Commercial Managed Care - PPO | Admitting: Family Medicine

## 2023-04-01 ENCOUNTER — Encounter: Payer: Self-pay | Admitting: Family Medicine

## 2023-04-01 VITALS — BP 126/72 | HR 60 | Ht 72.0 in | Wt 205.0 lb

## 2023-04-01 DIAGNOSIS — L299 Pruritus, unspecified: Secondary | ICD-10-CM

## 2023-04-01 DIAGNOSIS — K219 Gastro-esophageal reflux disease without esophagitis: Secondary | ICD-10-CM

## 2023-04-01 MED ORDER — OMEPRAZOLE 20 MG PO CPDR
20.0000 mg | DELAYED_RELEASE_CAPSULE | Freq: Every day | ORAL | 3 refills | Status: DC
Start: 2023-04-01 — End: 2024-05-18
  Filled 2023-04-01: qty 30, 30d supply, fill #0
  Filled 2023-05-01: qty 30, 30d supply, fill #1
  Filled 2023-08-11: qty 30, 30d supply, fill #2
  Filled 2023-11-24: qty 30, 30d supply, fill #3

## 2023-04-01 NOTE — Progress Notes (Signed)
Acute Office Visit  Subjective:     Patient ID: Joel Gallegos, male    DOB: [birth year], 57 y.o.   MRN: [id number]  Chief Complaint  Patient presents with   Pruritis    HPI Patient is in today for generalized itching.   Discussed the use of AI scribe software for clinical note transcription with the patient, who gave verbal consent to proceed.  History of Present Illness   The patient presents with generalized pruritus of unclear etiology. The itching, which is not associated with a rash, has been ongoing intermittently for the past several months. The patient has been alternating between here and [facility] and using different detergents, initially attributing the itching to these changes. However, the patient also reports a history of gardening, which was considered as a potential cause.  In addition to the pruritus, the patient has been experiencing frequent indigestion and heartburn. He has tried over-the-counter Prilosec, which provided some relief, but ran out of the medication. The patient also reports frequent diarrhea, which he attributes to a specific type of tea that he has been consuming. The patient notes that his stool is often yellow in color, but denies any other abdominal pain, unusual weight loss, or changes in energy level.  The patient's wife expressed concern about the itching being a symptom of liver disease, as a neighbor had experienced similar symptoms before being diagnosed with liver disease. This prompted the patient to seek medical attention.         ROS All review of systems negative except what is listed in the HPI      Objective:    BP 126/72   Pulse 60   Ht 6' (1.829 m)   Wt 205 lb (93 kg)   SpO2 99%   BMI 27.80 kg/m    Physical Exam Vitals reviewed.  Constitutional:      Appearance: Normal appearance.  Cardiovascular:     Rate and Rhythm: Normal rate and regular rhythm.     Heart sounds: Normal heart sounds.  Pulmonary:     Effort:  Pulmonary effort is normal.     Breath sounds: Normal breath sounds.  Abdominal:     General: Bowel sounds are normal. There is no distension.     Tenderness: There is no abdominal tenderness. There is no guarding.  Skin:    General: Skin is warm and dry.  Neurological:     Mental Status: He is alert and oriented to person, place, and time.  Psychiatric:        Mood and Affect: Mood normal.        Behavior: Behavior normal.        Thought Content: Thought content normal.        Judgment: Judgment normal.     No results found for any visits on 04/01/23.      Assessment & Plan:   Problem List Items Addressed This Visit   None Visit Diagnoses     Generalized pruritus    -  Primary   Relevant Orders   Comprehensive metabolic panel   CBC with Differential/Platelet   TSH   Gastroesophageal reflux disease, unspecified whether esophagitis present       Relevant Medications   omeprazole (PRILOSEC) 20 MG capsule         Generalized Itching No rash or dry skin. Possible contact dermatitis from different detergents. Patient is concerned for liver disease due to itching and indigestion. -Check liver enzymes today. -Use dye-free, fragrance-free detergent  consistently. -Apply emollient cream like CeraVe, Aveeno, or Eucerin after showering while skin is still damp.  Gastroesophageal Reflux Disease (GERD) Reports indigestion and heartburn. Previous use of Prilosec was helpful. -Restart Prilosec -Lifestyle measures for reflux: - Avoid meals or carbonated beverages within 3 hours of bedtime - Minimize intake of fried, fatty, and spicy foods (this will help decrease gastric acid production) - Raise the head of the bed using 4-6 inch blocks (especially if symptoms are present at night) - Maintain a healthy weight and avoid tight fitting clothes, especially around the waist  - Avoid foods that relax the sphincter or worsen symptoms (chocolate, peppermint, fatty foods, citrus, spicy  foods, tomatoes, coffee, caffeine)  - Minimize use of NSAIDs (ibuprofen, Aleve, etc), nicotine, and alcohol         Meds ordered this encounter  Medications   omeprazole (PRILOSEC) 20 MG capsule    Sig: Take 1 capsule (20 mg total) by mouth daily.    Dispense:  30 capsule    Refill:  3    Order Specific Question:   Supervising Provider    Answer:   Danise Edge A [[id number]]    Return if symptoms worsen or fail to improve.  Clayborne Dana, NP

## 2023-04-01 NOTE — Patient Instructions (Signed)
Lifestyle measures for reflux: - Avoid meals or carbonated beverages within 3 hours of bedtime - Minimize intake of fried, fatty, and spicy foods (this will help decrease gastric acid production) - Raise the head of the bed using 4-6 inch blocks (especially if symptoms are present at night) - Maintain a healthy weight and avoid tight fitting clothes, especially around the waist  - Avoid foods that relax the sphincter or worsen symptoms (chocolate, peppermint, fatty foods, citrus, spicy foods, tomatoes, coffee, caffeine)  - Minimize use of NSAIDs (ibuprofen, Aleve, etc), nicotine, and alcohol -Trial of adding back omeprazole

## 2023-04-02 LAB — CBC WITH DIFFERENTIAL/PLATELET
Basophils Absolute: 0.1 10*3/uL (ref 0.0–0.1)
Basophils Relative: 1.9 % (ref 0.0–3.0)
Eosinophils Absolute: 0.1 10*3/uL (ref 0.0–0.7)
Eosinophils Relative: 2.1 % (ref 0.0–5.0)
HCT: 48.6 % (ref 39.0–52.0)
Hemoglobin: 15.9 g/dL (ref 13.0–17.0)
Lymphocytes Relative: 23.9 % (ref 12.0–46.0)
Lymphs Abs: 1.6 10*3/uL (ref 0.7–4.0)
MCHC: 32.6 g/dL (ref 30.0–36.0)
MCV: 92.5 fL (ref 78.0–100.0)
Monocytes Absolute: 0.6 10*3/uL (ref 0.1–1.0)
Monocytes Relative: 9.2 % (ref 3.0–12.0)
Neutro Abs: 4.1 10*3/uL (ref 1.4–7.7)
Neutrophils Relative %: 62.9 % (ref 43.0–77.0)
Platelets: 215 10*3/uL (ref 150.0–400.0)
RBC: 5.26 Mil/uL (ref 4.22–5.81)
RDW: 13.2 % (ref 11.5–15.5)
WBC: 6.5 10*3/uL (ref 4.0–10.5)

## 2023-04-02 LAB — COMPREHENSIVE METABOLIC PANEL
ALT: 30 U/L (ref 0–53)
AST: 24 U/L (ref 0–37)
Albumin: 4 g/dL (ref 3.5–5.2)
Alkaline Phosphatase: 90 U/L (ref 39–117)
BUN: 15 mg/dL (ref 6–23)
CO2: 27 meq/L (ref 19–32)
Calcium: 9.3 mg/dL (ref 8.4–10.5)
Chloride: 104 meq/L (ref 96–112)
Creatinine, Ser: 1.02 mg/dL (ref 0.40–1.50)
GFR: 82.37 mL/min (ref >60.00)
Glucose, Bld: 90 mg/dL (ref 70–99)
Potassium: 4.2 meq/L (ref 3.5–5.1)
Sodium: 139 meq/L (ref 135–145)
Total Bilirubin: 1 mg/dL (ref 0.2–1.2)
Total Protein: 6.7 g/dL (ref 6.0–8.3)

## 2023-04-02 LAB — TSH: TSH: 0.9 u[IU]/mL (ref 0.35–5.50)

## 2023-04-08 ENCOUNTER — Other Ambulatory Visit (HOSPITAL_BASED_OUTPATIENT_CLINIC_OR_DEPARTMENT_OTHER): Payer: Self-pay

## 2023-04-08 MED ORDER — KETOCONAZOLE 2 % EX CREA
1.0000 | TOPICAL_CREAM | Freq: Two times a day (BID) | CUTANEOUS | 2 refills | Status: DC
  Filled 2023-04-08: qty 30, 15d supply, fill #0

## 2023-04-08 MED ORDER — HYDROCORTISONE 2.5 % EX CREA
1.0000 | TOPICAL_CREAM | Freq: Two times a day (BID) | CUTANEOUS | 2 refills | Status: DC
  Filled 2023-04-08: qty 30, 15d supply, fill #0

## 2023-08-11 ENCOUNTER — Other Ambulatory Visit (HOSPITAL_BASED_OUTPATIENT_CLINIC_OR_DEPARTMENT_OTHER): Payer: Self-pay

## 2023-09-09 ENCOUNTER — Encounter: Payer: Self-pay | Admitting: Family Medicine

## 2023-09-09 ENCOUNTER — Other Ambulatory Visit (HOSPITAL_BASED_OUTPATIENT_CLINIC_OR_DEPARTMENT_OTHER): Payer: Self-pay

## 2023-09-09 ENCOUNTER — Ambulatory Visit (INDEPENDENT_AMBULATORY_CARE_PROVIDER_SITE_OTHER): Payer: Commercial Managed Care - PPO | Admitting: Family Medicine

## 2023-09-09 VITALS — BP 112/76 | HR 76 | Temp 98.3°F | Resp 16 | Ht 72.0 in | Wt 198.6 lb

## 2023-09-09 DIAGNOSIS — Z23 Encounter for immunization: Secondary | ICD-10-CM

## 2023-09-09 DIAGNOSIS — E785 Hyperlipidemia, unspecified: Secondary | ICD-10-CM | POA: Diagnosis not present

## 2023-09-09 DIAGNOSIS — Z0000 Encounter for general adult medical examination without abnormal findings: Secondary | ICD-10-CM | POA: Diagnosis not present

## 2023-09-09 LAB — COMPREHENSIVE METABOLIC PANEL
ALT: 37 U/L (ref 0–53)
AST: 16 U/L (ref 0–37)
Albumin: 4.3 g/dL (ref 3.5–5.2)
Alkaline Phosphatase: 95 U/L (ref 39–117)
BUN: 15 mg/dL (ref 6–23)
CO2: 28 meq/L (ref 19–32)
Calcium: 9.6 mg/dL (ref 8.4–10.5)
Chloride: 104 meq/L (ref 96–112)
Creatinine, Ser: 1.03 mg/dL (ref 0.40–1.50)
GFR: 81.16 mL/min (ref >60.00)
Glucose, Bld: 88 mg/dL (ref 70–99)
Potassium: 4.2 meq/L (ref 3.5–5.1)
Sodium: 138 meq/L (ref 135–145)
Total Bilirubin: 1 mg/dL (ref 0.2–1.2)
Total Protein: 7.1 g/dL (ref 6.0–8.3)

## 2023-09-09 LAB — CBC WITH DIFFERENTIAL/PLATELET
Basophils Absolute: 0.1 10*3/uL (ref 0.0–0.1)
Basophils Relative: 0.8 % (ref 0.0–3.0)
Eosinophils Absolute: 0.2 10*3/uL (ref 0.0–0.7)
Eosinophils Relative: 2.4 % (ref 0.0–5.0)
HCT: 49.1 % (ref 39.0–52.0)
Hemoglobin: 16.3 g/dL (ref 13.0–17.0)
Lymphocytes Relative: 19.2 % (ref 12.0–46.0)
Lymphs Abs: 1.3 10*3/uL (ref 0.7–4.0)
MCHC: 33.2 g/dL (ref 30.0–36.0)
MCV: 91.8 fl (ref 78.0–100.0)
Monocytes Absolute: 0.6 10*3/uL (ref 0.1–1.0)
Monocytes Relative: 9.4 % (ref 3.0–12.0)
Neutro Abs: 4.7 10*3/uL (ref 1.4–7.7)
Neutrophils Relative %: 68.2 % (ref 43.0–77.0)
Platelets: 233 10*3/uL (ref 150.0–400.0)
RBC: 5.35 Mil/uL (ref 4.22–5.81)
RDW: 13.3 % (ref 11.5–15.5)
WBC: 6.9 10*3/uL (ref 4.0–10.5)

## 2023-09-09 LAB — LIPID PANEL
Cholesterol: 188 mg/dL (ref 0–200)
HDL: 48 mg/dL (ref >39.00)
LDL Cholesterol: 117 mg/dL — ABNORMAL HIGH (ref 0–99)
NonHDL: 139.76
Total CHOL/HDL Ratio: 4
Triglycerides: 115 mg/dL (ref 0.0–149.0)
VLDL: 23 mg/dL (ref 0.0–40.0)

## 2023-09-09 LAB — PSA: PSA: 1.53 ng/mL (ref 0.10–4.00)

## 2023-09-09 LAB — TSH: TSH: 0.87 u[IU]/mL (ref 0.35–5.50)

## 2023-09-09 NOTE — Progress Notes (Signed)
 +6    Established Patient Office Visit  Subjective   Patient ID: Joel Gallegos, male    DOB: 1966/05/04  Age: 58 y.o. MRN: [id number]  Chief Complaint  Patient presents with   Annual Exam    Pt states fasting    HPI Discussed the use of AI scribe software for clinical note transcription with the patient, who gave verbal consent to proceed.  History of Present Illness   He presents for an annual physical exam.  He recently experienced a gastrointestinal illness while visiting [place], characterized by vomiting and inability to keep food down for about 24 hours. He suspects food poisoning from a margarita consumed at [employer]. He did not seek medical care and symptoms resolved after a day. No other family members were affected.  He is not taking any regular medications currently, including Prilosec, which he used for about a month before discontinuing as symptoms improved. He takes Aleve occasionally, especially during long work shifts, to manage discomfort from prolonged standing and physical activity.  He has received a flu shot in October and is due for a tetanus shot by December. He has not received the shingles vaccine yet.  He is planning a vacation to the beach in May and has a work-related class in [place] prior to that. He works in [place] on 12-hour shifts and travels frequently, which involves flying twice a week.      Patient Active Problem List   Diagnosis Date Noted   Pain of right heel 02/04/2023   Preventative health care 04/18/2017   Strain of left wrist 01/29/2016   Closed displaced fracture of head of right radius with routine healing 01/29/2016   Erectile dysfunction 08/24/2012   DEPRESSIVE DISORDER NOT ELSEWHERE CLASSIFIED 03/02/2010   Benign neoplasm of skin 02/27/2009   Headache 02/27/2009   Headache 02/27/2009   EJACULATION, ABNORMAL 02/26/2008   History reviewed. No pertinent past medical history. Past Surgical History:  Procedure Laterality Date    KNEE ARTHROSCOPY Bilateral 1982   KNEE SURGERY     Social History   Tobacco Use   Smoking status: Former    Current packs/day: 0.00    Types: Cigarettes    Quit date: 07/01/1988    Years since quitting: 35.2   Smokeless tobacco: Never  Vaping Use   Vaping status: Never Used  Substance Use Topics   Alcohol use: Yes    Alcohol/week: 8.0 standard drinks of alcohol    Types: 8 Cans of beer per week   Drug use: No   Social History   Socioeconomic History   Marital status: Married    Spouse name: Not on file   Number of children: Not on file   Years of education: Not on file   Highest education level: Associate degree: occupational, Scientist, product/process development, or vocational program  Occupational History   Not on file  Tobacco Use   Smoking status: Former    Current packs/day: 0.00    Types: Cigarettes    Quit date: 07/01/1988    Years since quitting: 35.2   Smokeless tobacco: Never  Vaping Use   Vaping status: Never Used  Substance and Sexual Activity   Alcohol use: Yes    Alcohol/week: 8.0 standard drinks of alcohol    Types: 8 Cans of beer per week   Drug use: No   Sexual activity: Yes    Partners: Female  Other Topics Concern   Not on file  Social History Narrative   Exercise--no   Social  Drivers of Corporate investment banker Strain: Low Risk  (09/08/2023)   Overall Financial Resource Strain (CARDIA)    Difficulty of Paying Living Expenses: Not hard at all  Food Insecurity: No Food Insecurity (09/08/2023)   Hunger Vital Sign    Worried About Running Out of Food in the Last Year: Never true    Ran Out of Food in the Last Year: Never true  Transportation Needs: No Transportation Needs (09/08/2023)   PRAPARE - Administrator, Civil Service (Medical): No    Lack of Transportation (Non-Medical): No  Physical Activity: Insufficiently Active (09/08/2023)   Exercise Vital Sign    Days of Exercise per Week: 2 days    Minutes of Exercise per Session: 10 min  Stress: No  Stress Concern Present (09/08/2023)   [employer] of Occupational Health - Occupational Stress Questionnaire    Feeling of Stress : Not at all  Social Connections: Socially Integrated (09/08/2023)   Social Connection and Isolation Panel [NHANES]    Frequency of Communication with Friends and Family: Twice a week    Frequency of Social Gatherings with Friends and Family: Once a week    Attends Religious Services: 1 to 4 times per year    Active Member of [employer] or Organizations: Yes    Attends Banker Meetings: 1 to 4 times per year    Marital Status: Married  Catering manager Violence: Not on file   Family Status  Relation Name Status   Mother  Alive   Father  Alive   Neg Hx  (Not Specified)  No partnership data on file   Family History  Problem Relation Age of Onset   Colon cancer Neg Hx    Esophageal cancer Neg Hx    Rectal cancer Neg Hx    Stomach cancer Neg Hx    No Known Allergies    Review of Systems  Constitutional:  Negative for chills, fever and malaise/fatigue.  HENT:  Negative for congestion and hearing loss.   Eyes:  Negative for blurred vision and discharge.  Respiratory:  Negative for cough, sputum production and shortness of breath.   Cardiovascular:  Negative for chest pain, palpitations and leg swelling.  Gastrointestinal:  Negative for abdominal pain, blood in stool, constipation, diarrhea, heartburn, nausea and vomiting.  Genitourinary:  Negative for dysuria, frequency, hematuria and urgency.  Musculoskeletal:  Negative for back pain, falls and myalgias.  Skin:  Negative for rash.  Neurological:  Negative for dizziness, sensory change, loss of consciousness, weakness and headaches.  Endo/Heme/Allergies:  Negative for environmental allergies. Does not bruise/bleed easily.  Psychiatric/Behavioral:  Negative for depression and suicidal ideas. The patient is not nervous/anxious and does not have insomnia.       Objective:     BP 112/76  (BP Location: Left Arm, Patient Position: Sitting, Cuff Size: Normal)   Pulse 76   Temp 98.3 F (36.8 C) (Oral)   Resp 16   Ht 6' (1.829 m)   Wt 198 lb 9.6 oz (90.1 kg)   SpO2 96%   BMI 26.94 kg/m  BP Readings from Last 3 Encounters:  09/09/23 112/76  04/01/23 126/72  02/04/23 124/74   Wt Readings from Last 3 Encounters:  09/09/23 198 lb 9.6 oz (90.1 kg)  04/01/23 205 lb (93 kg)  02/04/23 206 lb 3.2 oz (93.5 kg)   SpO2 Readings from Last 3 Encounters:  09/09/23 96%  04/01/23 99%  02/04/23 98%      Physical  Exam Vitals and nursing note reviewed.  Constitutional:      General: He is not in acute distress.    Appearance: Normal appearance. He is well-developed.  HENT:     Head: Normocephalic and atraumatic.     Right Ear: Tympanic membrane, ear canal and external ear normal. There is no impacted cerumen.     Left Ear: Tympanic membrane, ear canal and external ear normal. There is no impacted cerumen.     Nose: Nose normal.     Mouth/Throat:     Mouth: Mucous membranes are moist.     Pharynx: Oropharynx is clear. No oropharyngeal exudate or posterior oropharyngeal erythema.  Eyes:     General: No scleral icterus.       Right eye: No discharge.        Left eye: No discharge.     Conjunctiva/sclera: Conjunctivae normal.     Pupils: Pupils are equal, round, and reactive to light.  Neck:     Thyroid: No thyromegaly.     Vascular: No JVD.  Cardiovascular:     Rate and Rhythm: Normal rate and regular rhythm.     Heart sounds: Normal heart sounds. No murmur heard. Pulmonary:     Effort: Pulmonary effort is normal. No respiratory distress.     Breath sounds: Normal breath sounds.  Abdominal:     General: Bowel sounds are normal. There is no distension.     Palpations: Abdomen is soft. There is no mass.     Tenderness: There is no abdominal tenderness. There is no guarding or rebound.  Musculoskeletal:        General: Normal range of motion.     Cervical back: Normal  range of motion and neck supple.     Right lower leg: No edema.     Left lower leg: No edema.  Lymphadenopathy:     Cervical: No cervical adenopathy.  Skin:    General: Skin is warm and dry.     Findings: No erythema or rash.  Neurological:     Mental Status: He is alert and oriented to person, place, and time.     Cranial Nerves: No cranial nerve deficit.     Motor: No abnormal muscle tone.     Deep Tendon Reflexes: Reflexes are normal and symmetric. Reflexes normal.  Psychiatric:        Mood and Affect: Mood normal.        Behavior: Behavior normal.        Thought Content: Thought content normal.        Judgment: Judgment normal.      Results for orders placed or performed in visit on 09/09/23  Lipid panel  Result Value Ref Range   Cholesterol 188 0 - 200 mg/dL   Triglycerides 865.7 0.0 - 149.0 mg/dL   HDL 84.69 >62.95 mg/dL   VLDL 28.4 0.0 - 13.2 mg/dL   LDL Cholesterol 440 (H) 0 - 99 mg/dL   Total CHOL/HDL Ratio 4    NonHDL 139.76   PSA  Result Value Ref Range   PSA 1.53 0.10 - 4.00 ng/mL  TSH  Result Value Ref Range   TSH 0.87 0.35 - 5.50 uIU/mL  Comprehensive metabolic panel  Result Value Ref Range   Sodium 138 135 - 145 mEq/L   Potassium 4.2 3.5 - 5.1 mEq/L   Chloride 104 96 - 112 mEq/L   CO2 28 19 - 32 mEq/L   Glucose, Bld 88 70 - 99 mg/dL  BUN 15 6 - 23 mg/dL   Creatinine, Ser 7.82 0.40 - 1.50 mg/dL   Total Bilirubin 1.0 0.2 - 1.2 mg/dL   Alkaline Phosphatase 95 39 - 117 U/L   AST 16 0 - 37 U/L   ALT 37 0 - 53 U/L   Total Protein 7.1 6.0 - 8.3 g/dL   Albumin 4.3 3.5 - 5.2 g/dL   GFR 95.62 >13.08 mL/min   Calcium 9.6 8.4 - 10.5 mg/dL  CBC with Differential/Platelet  Result Value Ref Range   WBC 6.9 4.0 - 10.5 K/uL   RBC 5.35 4.22 - 5.81 Mil/uL   Hemoglobin 16.3 13.0 - 17.0 g/dL   HCT 65.7 84.6 - 96.2 %   MCV 91.8 78.0 - 100.0 fl   MCHC 33.2 30.0 - 36.0 g/dL   RDW 95.2 84.1 - 32.4 %   Platelets 233.0 150.0 - 400.0 K/uL   Neutrophils Relative %  68.2 43.0 - 77.0 %   Lymphocytes Relative 19.2 12.0 - 46.0 %   Monocytes Relative 9.4 3.0 - 12.0 %   Eosinophils Relative 2.4 0.0 - 5.0 %   Basophils Relative 0.8 0.0 - 3.0 %   Neutro Abs 4.7 1.4 - 7.7 K/uL   Lymphs Abs 1.3 0.7 - 4.0 K/uL   Monocytes Absolute 0.6 0.1 - 1.0 K/uL   Eosinophils Absolute 0.2 0.0 - 0.7 K/uL   Basophils Absolute 0.1 0.0 - 0.1 K/uL    Last CBC Lab Results  Component Value Date   WBC 6.9 09/09/2023   HGB 16.3 09/09/2023   HCT 49.1 09/09/2023   MCV 91.8 09/09/2023   RDW 13.3 09/09/2023   PLT 233.0 09/09/2023   Last metabolic panel Lab Results  Component Value Date   GLUCOSE 88 09/09/2023   NA 138 09/09/2023   K 4.2 09/09/2023   CL 104 09/09/2023   CO2 28 09/09/2023   BUN 15 09/09/2023   CREATININE 1.03 09/09/2023   GFR 81.16 09/09/2023   CALCIUM 9.6 09/09/2023   PROT 7.1 09/09/2023   ALBUMIN 4.3 09/09/2023   BILITOT 1.0 09/09/2023   ALKPHOS 95 09/09/2023   AST 16 09/09/2023   ALT 37 09/09/2023   Last lipids Lab Results  Component Value Date   CHOL 188 09/09/2023   HDL 48.00 09/09/2023   LDLCALC 117 (H) 09/09/2023   LDLDIRECT 123.9 06/16/2012   TRIG 115.0 09/09/2023   CHOLHDL 4 09/09/2023   Last hemoglobin A1c No results found for: "HGBA1C" Last thyroid functions Lab Results  Component Value Date   TSH 0.87 09/09/2023   Last vitamin D No results found for: "25OHVITD2", "25OHVITD3", "VD25OH" Last vitamin B12 and Folate No results found for: "VITAMINB12", "FOLATE"    The 10-year ASCVD risk score (Arnett DK, et al., 2019) is: 4.7%    Assessment & Plan:   Problem List Items Addressed This Visit       Unprioritized   Preventative health care - Primary   Ghm utd Check labs  See AVS Health Maintenance  Topic Date Due   HIV Screening  Never done   Zoster Vaccines- Shingrix (1 of 2) Never done   COVID-19 Vaccine (4 - 2024-25 season) 03/27/2024 (Originally 03/02/2023)   Colonoscopy  07/15/2027   DTaP/Tdap/Td (5 - Td or  Tdap) 09/08/2033   INFLUENZA VACCINE  Completed   Hepatitis C Screening  Completed   HPV VACCINES  Aged Out         Relevant Orders   Lipid panel (Completed)   PSA (Completed)  TSH (Completed)   Comprehensive metabolic panel (Completed)   CBC with Differential/Platelet (Completed)   Other Visit Diagnoses       Hyperlipidemia, unspecified hyperlipidemia type         Need for Tdap vaccination       Relevant Orders   Tdap vaccine greater than or equal to 7yo IM (Completed)      Assessment and Plan    Gastroenteritis   Acute gastroenteritis likely due to food poisoning presented with vomiting and diarrhea for about 24 hours. COVID-19 and influenza tests were negative. Symptoms resolved within a day without the need for urgent care. Maintain hydration and rest as needed.  General Health Maintenance   He is up to date with the influenza vaccination as of October. A tetanus booster is due by December. He has not received the shingles vaccine but is considering it. Regular follow-ups with a dermatologist, ophthalmologist, and dentist are ongoing. He is considering turmeric for joint pain, which is well-tolerated and not expected to interfere with drug tests. Administer the tetanus booster by December. Discuss the shingles vaccine at future visits. Continue regular follow-ups with specialists. Consider turmeric for joint pain management.       Return in about 1 year (around 09/08/2024), or if symptoms worsen or fail to improve, for annual exam, fasting.    Donato Schultz, DO

## 2023-09-09 NOTE — Patient Instructions (Signed)

## 2023-09-09 NOTE — Assessment & Plan Note (Signed)
 Ghm utd Check labs  See AVS Health Maintenance  Topic Date Due   HIV Screening  Never done   Zoster Vaccines- Shingrix (1 of 2) Never done   COVID-19 Vaccine (4 - 2024-25 season) 03/27/2024 (Originally 03/02/2023)   Colonoscopy  07/15/2027   DTaP/Tdap/Td (5 - Td or Tdap) 09/08/2033   INFLUENZA VACCINE  Completed   Hepatitis C Screening  Completed   HPV VACCINES  Aged Out

## 2023-09-17 ENCOUNTER — Encounter: Payer: Self-pay | Admitting: Family Medicine

## 2024-05-18 ENCOUNTER — Other Ambulatory Visit: Payer: Self-pay | Admitting: Family Medicine

## 2024-05-18 DIAGNOSIS — K219 Gastro-esophageal reflux disease without esophagitis: Secondary | ICD-10-CM

## 2024-05-19 ENCOUNTER — Other Ambulatory Visit: Payer: Self-pay

## 2024-05-19 ENCOUNTER — Other Ambulatory Visit (HOSPITAL_BASED_OUTPATIENT_CLINIC_OR_DEPARTMENT_OTHER): Payer: Self-pay

## 2024-05-19 MED ORDER — OMEPRAZOLE 20 MG PO CPDR
20.0000 mg | DELAYED_RELEASE_CAPSULE | Freq: Every day | ORAL | 1 refills | Status: AC
  Filled 2024-05-19: qty 90, 90d supply, fill #0
